# Patient Record
Sex: Male | Born: 1960 | Race: White | Hispanic: No | Marital: Single | Smoking: Former smoker
Health system: Southern US, Community
[De-identification: ages and names within clinical notes are randomized; demographics above are authoritative.]

## PROBLEM LIST (undated history)

## (undated) DIAGNOSIS — E785 Hyperlipidemia, unspecified: Secondary | ICD-10-CM

## (undated) DIAGNOSIS — N189 Chronic kidney disease, unspecified: Secondary | ICD-10-CM

## (undated) DIAGNOSIS — E7800 Pure hypercholesterolemia, unspecified: Secondary | ICD-10-CM

## (undated) DIAGNOSIS — I10 Essential (primary) hypertension: Secondary | ICD-10-CM

## (undated) DIAGNOSIS — F419 Anxiety disorder, unspecified: Secondary | ICD-10-CM

## (undated) DIAGNOSIS — E119 Type 2 diabetes mellitus without complications: Secondary | ICD-10-CM

## (undated) DIAGNOSIS — F32A Depression, unspecified: Secondary | ICD-10-CM

## (undated) DIAGNOSIS — N2581 Secondary hyperparathyroidism of renal origin: Secondary | ICD-10-CM

## (undated) DIAGNOSIS — F329 Major depressive disorder, single episode, unspecified: Secondary | ICD-10-CM

## (undated) HISTORY — PX: CHOLECYSTECTOMY: SHX55

---

## 2005-06-18 ENCOUNTER — Ambulatory Visit: Payer: Self-pay | Admitting: Cardiovascular Disease

## 2005-06-21 ENCOUNTER — Emergency Department: Payer: Self-pay | Admitting: Emergency Medicine

## 2005-06-25 ENCOUNTER — Ambulatory Visit: Payer: Self-pay | Admitting: Gastroenterology

## 2005-09-13 ENCOUNTER — Emergency Department: Payer: Self-pay | Admitting: General Practice

## 2007-06-29 ENCOUNTER — Emergency Department: Payer: Self-pay | Admitting: Emergency Medicine

## 2008-03-08 ENCOUNTER — Emergency Department: Payer: Self-pay | Admitting: Emergency Medicine

## 2008-03-28 ENCOUNTER — Ambulatory Visit: Payer: Self-pay | Admitting: Nurse Practitioner

## 2008-05-08 ENCOUNTER — Emergency Department: Payer: Self-pay | Admitting: Emergency Medicine

## 2008-06-14 ENCOUNTER — Ambulatory Visit: Payer: Self-pay | Admitting: Nurse Practitioner

## 2008-08-29 ENCOUNTER — Ambulatory Visit: Payer: Self-pay

## 2009-10-29 ENCOUNTER — Emergency Department: Payer: Self-pay | Admitting: Emergency Medicine

## 2010-02-14 ENCOUNTER — Emergency Department: Payer: Self-pay | Admitting: Emergency Medicine

## 2011-02-18 ENCOUNTER — Emergency Department: Payer: Self-pay | Admitting: Emergency Medicine

## 2013-02-21 ENCOUNTER — Ambulatory Visit: Payer: Self-pay | Admitting: Physician Assistant

## 2013-03-07 DIAGNOSIS — F32A Depression, unspecified: Secondary | ICD-10-CM | POA: Insufficient documentation

## 2013-03-07 DIAGNOSIS — E119 Type 2 diabetes mellitus without complications: Secondary | ICD-10-CM

## 2013-08-09 DIAGNOSIS — K219 Gastro-esophageal reflux disease without esophagitis: Secondary | ICD-10-CM | POA: Insufficient documentation

## 2013-08-23 DIAGNOSIS — F419 Anxiety disorder, unspecified: Secondary | ICD-10-CM | POA: Insufficient documentation

## 2013-08-23 DIAGNOSIS — I10 Essential (primary) hypertension: Secondary | ICD-10-CM | POA: Insufficient documentation

## 2013-08-23 DIAGNOSIS — E785 Hyperlipidemia, unspecified: Secondary | ICD-10-CM | POA: Insufficient documentation

## 2013-10-25 DIAGNOSIS — E161 Other hypoglycemia: Secondary | ICD-10-CM | POA: Insufficient documentation

## 2013-12-14 ENCOUNTER — Emergency Department: Payer: Self-pay | Admitting: Emergency Medicine

## 2013-12-14 LAB — BASIC METABOLIC PANEL
Anion Gap: 12 (ref 7–16)
BUN: 26 mg/dL — ABNORMAL HIGH (ref 7–18)
CREATININE: 1.23 mg/dL (ref 0.60–1.30)
Calcium, Total: 9.2 mg/dL (ref 8.5–10.1)
Chloride: 103 mmol/L (ref 98–107)
Co2: 21 mmol/L (ref 21–32)
GLUCOSE: 158 mg/dL — AB (ref 65–99)
Osmolality: 280 (ref 275–301)
Potassium: 4.1 mmol/L (ref 3.5–5.1)
Sodium: 136 mmol/L (ref 136–145)

## 2013-12-14 LAB — TROPONIN I: Troponin-I: <0.02 ng/mL

## 2013-12-14 LAB — CBC
HCT: 47.5 % (ref 40.0–52.0)
HGB: 15.6 g/dL (ref 13.0–18.0)
MCH: 29.7 pg (ref 26.0–34.0)
MCHC: 32.9 g/dL (ref 32.0–36.0)
MCV: 90 fL (ref 80–100)
PLATELETS: 211 10*3/uL (ref 150–440)
RBC: 5.26 10*6/uL (ref 4.40–5.90)
RDW: 13 % (ref 11.5–14.5)
WBC: 7.2 10*3/uL (ref 3.8–10.6)

## 2014-05-30 DIAGNOSIS — B351 Tinea unguium: Secondary | ICD-10-CM | POA: Insufficient documentation

## 2014-10-07 ENCOUNTER — Emergency Department
Admission: EM | Admit: 2014-10-07 | Discharge: 2014-10-07 | Disposition: A | Discharge status: Home / Self Care | Payer: BLUE CROSS/BLUE SHIELD | Attending: Emergency Medicine | Admitting: Emergency Medicine

## 2014-10-07 ENCOUNTER — Encounter: Payer: Self-pay | Admitting: *Deleted

## 2014-10-07 DIAGNOSIS — Z87891 Personal history of nicotine dependence: Secondary | ICD-10-CM | POA: Insufficient documentation

## 2014-10-07 DIAGNOSIS — E139 Other specified diabetes mellitus without complications: Secondary | ICD-10-CM | POA: Insufficient documentation

## 2014-10-07 DIAGNOSIS — R079 Chest pain, unspecified: Secondary | ICD-10-CM

## 2014-10-07 HISTORY — DX: Type 2 diabetes mellitus without complications: E11.9

## 2014-10-07 HISTORY — DX: Essential (primary) hypertension: I10

## 2014-10-07 LAB — BASIC METABOLIC PANEL
Anion gap: 14 (ref 5–15)
BUN: 48 mg/dL — ABNORMAL HIGH (ref 6–20)
CO2: 23 mmol/L (ref 22–32)
Calcium: 9.8 mg/dL (ref 8.9–10.3)
Chloride: 96 mmol/L — ABNORMAL LOW (ref 101–111)
Creatinine, Ser: 1.51 mg/dL — ABNORMAL HIGH (ref 0.61–1.24)
GFR, EST AFRICAN AMERICAN: 59 mL/min — AB (ref >60)
GFR, EST NON AFRICAN AMERICAN: 51 mL/min — AB (ref >60)
Glucose, Bld: 178 mg/dL — ABNORMAL HIGH (ref 65–99)
POTASSIUM: 4.6 mmol/L (ref 3.5–5.1)
SODIUM: 133 mmol/L — AB (ref 135–145)

## 2014-10-07 LAB — CBC
HCT: 47.8 % (ref 40.0–52.0)
HEMOGLOBIN: 15.6 g/dL (ref 13.0–18.0)
MCH: 29.1 pg (ref 26.0–34.0)
MCHC: 32.7 g/dL (ref 32.0–36.0)
MCV: 89.2 fL (ref 80.0–100.0)
Platelets: 247 10*3/uL (ref 150–440)
RBC: 5.36 MIL/uL (ref 4.40–5.90)
RDW: 13.1 % (ref 11.5–14.5)
WBC: 8.8 10*3/uL (ref 3.8–10.6)

## 2014-10-07 LAB — TROPONIN I

## 2014-10-07 NOTE — Discharge Instructions (Signed)
Continue your current treatment for diabetes. Follow with her regular doctor for these very brief intermittent episodes of chest pain. Return to the emergency department if you've chest pain lasts 5 or 10 minutes in length or if you have shortness of breath, if you aren't raking into a sweat, or if you have other urgent concerns.  Chest Pain (Nonspecific) It is often hard to give a diagnosis for the cause of chest pain. There is always a chance that your pain could be related to something serious, such as a heart attack or a blood clot in the lungs. You need to follow up with your doctor. HOME CARE  If antibiotic medicine was given, take it as directed by your doctor. Finish the medicine even if you start to feel better.  For the next few days, avoid activities that bring on chest pain. Continue physical activities as told by your doctor.  Do not use any tobacco products. This includes cigarettes, chewing tobacco, and e-cigarettes.  Avoid drinking alcohol.  Only take medicine as told by your doctor.  Follow your doctor's suggestions for more testing if your chest pain does not go away.  Keep all doctor visits you made. GET HELP IF:  Your chest pain does not go away, even after treatment.  You have a rash with blisters on your chest.  You have a fever. GET HELP RIGHT AWAY IF:   You have more pain or pain that spreads to your arm, neck, jaw, back, or belly (abdomen).  You have shortness of breath.  You cough more than usual or cough up blood.  You have very bad back or belly pain.  You feel sick to your stomach (nauseous) or throw up (vomit).  You have very bad weakness.  You pass out (faint).  You have chills. This is an emergency. Do not wait to see if the problems will go away. Call your local emergency services (911 in [place] Do not drive yourself to the hospital. MAKE SURE YOU:   Understand these instructions.  Will watch your condition.  Will get help right away  if you are not doing well or get worse. Document Released: 10/09/2007 Document Revised: 04/27/2013 Document Reviewed: 10/09/2007 [facility] Patient Information 2015 [employer] [place]. This information is not intended to replace advice given to you by your health care provider. Make sure you discuss any questions you have with your health care provider.

## 2014-10-07 NOTE — ED Provider Notes (Signed)
[facility] Emergency Department Provider Note  ____________________________________________  Time seen:  2050  I have reviewed the triage vital signs and the nursing notes.   HISTORY  Chief Complaint Chest Pain     HPI Erik Larsen is a 54 y.o. male who reports he began having chest pain this past Tuesday after he began a new regimen for his diabetes. The chest pain lasts 10 seconds usually. It is intermittent. On occasion it has lasted as long as 30 seconds. His last episode was this afternoon before coming to the hospital. He had called his primary physician at [facility]. They advised him to come to the emergency department due to his chest pain.  The patient does have diabetes that has been difficult control thus the new regimen for his medications.  He denies nausea, shortness of breath, or diaphoresis.    Past Medical History  Diagnosis Date  . Diabetes mellitus without complication   . Hypertension     There are no active problems to display for this patient.   Past Surgical History  Procedure Laterality Date  . Cholecystectomy      No current outpatient prescriptions on file.  Allergies Viagra  No family history on file.  Social History History  Substance Use Topics  . Smoking status: Former Games developermoker  . Smokeless tobacco: Not on file  . Alcohol Use: No    Review of Systems  Constitutional: Negative for fever. ENT: Negative for sore throat. Cardiovascular:Positive for very brief episodes of chest pain since Tuesday.Marland Kitchen. Respiratory: Negative for shortness of breath. Gastrointestinal: Negative for abdominal pain, vomiting and diarrhea. Genitourinary: Negative for dysuria. Musculoskeletal: Negative for back pain. Skin: Negative for rash. Neurological: Negative for headaches   10-point ROS otherwise negative.  ____________________________________________   PHYSICAL EXAM:  VITAL SIGNS: ED Triage  Vitals  Enc Vitals Group     BP 10/07/14 1726 136/67 mmHg     Pulse Rate 10/07/14 1726 90     Resp 10/07/14 1726 20     Temp 10/07/14 1726 97.5 F (36.4 C)     Temp Source 10/07/14 1726 Oral     SpO2 10/07/14 1726 95 %     Weight 10/07/14 1726 244 lb (110.678 kg)     Height 10/07/14 1726 5\' 8"  (1.727 m)     Head Cir --      Peak Flow --      Pain Score --      Pain Loc --      Pain Edu? --      Excl. in GC? --     Constitutional: Alert and oriented. Well appearing and in no distress. ENT   Head: Normocephalic and atraumatic.   Nose: No congestion/rhinnorhea.   Mouth/Throat: Mucous membranes are moist. Cardiovascular: Normal rate, regular rhythm. Respiratory: Normal respiratory effort without tachypnea. Breath sounds are clear and equal bilaterally. No wheezes/rales/rhonchi. Gastrointestinal: Soft and nontender. No distention.  Back: No muscle spasm, no tenderness, no CVA tenderness. Musculoskeletal: Nontender with normal range of motion in all extremities.  No noted edema. Neurologic:  Normal speech and language. No gross focal neurologic deficits are appreciated.  Skin:  Notable skin tags in the axilla's.Skin is warm, dry. No rash noted. Psychiatric: Mood and affect are normal. Speech and behavior are normal.  ____________________________________________    LABS (pertinent positives/negatives)  CBC is normal Metabolic panel shows elevation in BUN 48, creatinine of 1.5. Glucose is 178. Troponin is negative.  ____________________________________________  EKG  ED ECG REPORT I, Myda Detwiler W, the attending physician, personally viewed and interpreted this ECG.   Date: 10/07/2014  EKG Time: 1730  Rate: 89  Rhythm: normal sinus rhythm  Axis: normal at -2  Intervals: normal  ST&T Change: none   ____________________________________________   INITIAL IMPRESSION / ASSESSMENT AND PLAN / ED COURSE  Well-appearing pleasant 54 year old male in no acute  distress. He has had very brief episodes of chest pain for 4 days. The pattern does not appear to be worrisome for coronary artery disease. He is in no pain currently. He has a normal EKG and a negative troponin. We will discharge him home to follow with his primary physician.  ____________________________________________   FINAL CLINICAL IMPRESSION(S) / ED DIAGNOSES  Final diagnoses:  Nonspecific chest pain  Other specified diabetes mellitus without complications      Darien Ramus, MD 10/07/14 2109

## 2016-02-16 ENCOUNTER — Emergency Department: Payer: BLUE CROSS/BLUE SHIELD

## 2016-02-16 ENCOUNTER — Other Ambulatory Visit: Payer: Self-pay

## 2016-02-16 ENCOUNTER — Emergency Department
Admission: EM | Admit: 2016-02-16 | Discharge: 2016-02-17 | Disposition: A | Discharge status: Home / Self Care | Payer: BLUE CROSS/BLUE SHIELD | Attending: Student | Admitting: Student

## 2016-02-16 ENCOUNTER — Encounter: Payer: Self-pay | Admitting: *Deleted

## 2016-02-16 DIAGNOSIS — Z87891 Personal history of nicotine dependence: Secondary | ICD-10-CM | POA: Insufficient documentation

## 2016-02-16 DIAGNOSIS — R410 Disorientation, unspecified: Secondary | ICD-10-CM | POA: Diagnosis not present

## 2016-02-16 DIAGNOSIS — E119 Type 2 diabetes mellitus without complications: Secondary | ICD-10-CM | POA: Diagnosis not present

## 2016-02-16 DIAGNOSIS — R5383 Other fatigue: Secondary | ICD-10-CM

## 2016-02-16 DIAGNOSIS — R531 Weakness: Secondary | ICD-10-CM

## 2016-02-16 DIAGNOSIS — R079 Chest pain, unspecified: Secondary | ICD-10-CM | POA: Diagnosis present

## 2016-02-16 DIAGNOSIS — I10 Essential (primary) hypertension: Secondary | ICD-10-CM | POA: Insufficient documentation

## 2016-02-16 HISTORY — DX: Major depressive disorder, single episode, unspecified: F32.9

## 2016-02-16 HISTORY — DX: Pure hypercholesterolemia, unspecified: E78.00

## 2016-02-16 HISTORY — DX: Depression, unspecified: F32.A

## 2016-02-16 LAB — GLUCOSE, CAPILLARY: Glucose-Capillary: 195 mg/dL — ABNORMAL HIGH (ref 65–99)

## 2016-02-16 NOTE — ED Provider Notes (Signed)
[facility] Emergency Department Provider Note   ____________________________________________   First MD Initiated Contact with Patient 02/16/16 2355     (approximate)  I have reviewed the triage vital signs and the nursing notes.   HISTORY  Chief Complaint Weakness and Chest Pain    HPI Erik Larsen is a 55 y.o. male history of hypertension, hyperlipidemia, diabetes, depression who presents for a variety of nonspecific complaints ongoing for 3 weeks, gradual onset, intermittent, mild to moderate, no modifying factors. His first complaint is that he feels generally fatigued and very tired. 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 Additionally he reports that over this same re-weeks he has had 2 episodes of lancinating right-sided chest pain radiating into the right arm, it happens at rest, it is not worse with exertion, the pain lasts "for a few seconds" and goes away, is not associated with any shortness of breath, no sudden sweating, pain is not ripping or tearing in nature, does not radiate to the back or down towards the feet, pain is not worse with deep inspiration.. He had an episode of that yesterday afternoon but has not had any pain since then. He also has felt lightheaded at times. He is wondering whether or not this is related to his new low-carb diet or whether or not it is related to all of the mountain dews that he has been drinking, he drinks several in a day. No vomiting, diarrhea, fevers or chills. He is also complaining of some pain around the sinuses/in his face.   Past Medical History:  Diagnosis Date  . Depression   . Diabetes mellitus without complication (HCC)   . Hypercholesteremia   . Hypertension     There are no active problems to display for this patient.   Past Surgical History:  Procedure Laterality Date  . CHOLECYSTECTOMY      Prior to Admission  medications   Not on File    Allergies Viagra [sildenafil citrate]  History reviewed. No pertinent family history.  Social History Social History  Substance Use Topics  . Smoking status: Former Games developermoker  . Smokeless tobacco: Never Used  . Alcohol use No    Review of Systems Constitutional: No fever/chills Eyes: No visual changes. ENT: No sore throat. Cardiovascular: + chest pain. Respiratory: Denies shortness of breath. Gastrointestinal: No abdominal pain.  No nausea, no vomiting.  No diarrhea.  No constipation. Genitourinary: Negative for dysuria. Musculoskeletal: Negative for back pain. Skin: Negative for rash. Neurological: Negative for headaches, focal weakness or numbness.  10-point ROS otherwise negative.  ____________________________________________   PHYSICAL EXAM:  VITAL SIGNS: ED Triage Vitals  Enc Vitals Group     BP 02/16/16 2311 134/76     Pulse Rate 02/16/16 2311 89     Resp 02/16/16 2311 18     Temp 02/16/16 2311 98.2 F (36.8 C)     Temp Source 02/16/16 2311 Oral     SpO2 02/16/16 2311 97 %     Weight 02/16/16 2311 250 lb (113.4 kg)     Height 02/16/16 2311 5\' 8"  (1.727 m)     Head Circumference --      Peak Flow --      Pain Score 02/16/16 2312 4     Pain Loc --      Pain Edu? --      Excl. in GC? --     Constitutional: Alert  and oriented x 4. Well appearing and in no acute distress. Eyes: Conjunctivae are normal. PERRL. EOMI. Head: Atraumatic. No tenderness over the maxillary or frontal sinuses. Nose: No congestion/rhinnorhea. Mouth/Throat: Mucous membranes are moist.  Oropharynx non-erythematous. Neck: No stridor.  Supple without meningismus. Cardiovascular: Normal rate, regular rhythm. Grossly normal heart sounds.  Good peripheral circulation. Respiratory: Normal respiratory effort.  No retractions. Lungs CTAB. Gastrointestinal: Soft and nontender. No distention. No CVA tenderness. Genitourinary: deferred Musculoskeletal: No lower  extremity tenderness nor edema.  No joint effusions. Neurologic:  Normal speech and language. No gross focal neurologic deficits are appreciated. No gait instability. 5 out of 5 strength in bilateral upper and lower extremities, sensation intact to light touch throughout, cranial nerves II through XII intact, normal finger-nose-finger, normal heel shin, normal tandem walk. There is no ataxia. Skin:  Skin is warm, dry and intact. No rash noted. Psychiatric: Mood and affect are normal. Speech and behavior are normal.  ____________________________________________   LABS (all labs ordered are listed, but only abnormal results are displayed)  Labs Reviewed  GLUCOSE, CAPILLARY - Abnormal; Notable for the following:       Result Value   Glucose-Capillary 195 (*)    All other components within normal limits  URINALYSIS COMPLETEWITH MICROSCOPIC ([facility] ONLY)  CBC WITH DIFFERENTIAL/PLATELET  COMPREHENSIVE METABOLIC PANEL  TROPONIN I  CBG MONITORING, ED   ____________________________________________  EKG  ED ECG REPORT I, Gayla Doss, the attending physician, personally viewed and interpreted this ECG.   Date: 02/17/2016  EKG Time: 22:51  Rate: 86  Rhythm: normal EKG, normal sinus rhythm  Axis: normal  Intervals:none  ST&T Change: No acute ST elevation or acute ST depression.  ____________________________________________  RADIOLOGY  CXR IMPRESSION:  No active cardiopulmonary disease.       CT head IMPRESSION:  No acute intracranial abnormality is identified. Unremarkable CT of  the head for age.     ____________________________________________   PROCEDURES  Procedure(s) performed: None  Procedures  Critical Care performed: No  ____________________________________________   INITIAL IMPRESSION / ASSESSMENT AND PLAN / ED COURSE  Pertinent labs & imaging results that were available during my care of the patient were reviewed by me and considered in my medical  decision making (see chart for details).  Erik Larsen is a 55 y.o. male history of hypertension, hyperlipidemia, diabetes, depression who presents for a variety of nonspecific complaints ongoing for 3 weeks, gradual onset, intermittent, mild to moderate, no modifying factors. On exam, he is well-appearing and in no acute distress. Vital signs stable and he is afebrile. EKG is reassuring, not consistent with acute ischemia, he is chest pain-free. He is having atypical lancinating chest pain which is nonspecific. Not consistent with PE or acute aortic dissection or ACS. Troponin negative and it was drawn several hours after onset and resolution of pain. He has an intact neurological examination. Contrary to the nursing note, there is no ataxia on exam. There is no evidence to suggest acute CVA. CBC is generally unremarkable. CMP notable for mild AST and ALT elevations at 56 and 79, glucose is 170, these are nonspecific. Urinalysis is not consistent with infection. CT head shows no acute abnormality. Chest x-ray shows no acute cardio pulmonary process. We discussed return precautions and need for close PCP follow-up and he is comfortable with the discharge plan. DC home.  Clinical Course     ____________________________________________   FINAL CLINICAL IMPRESSION(S) / ED DIAGNOSES  Final diagnoses:  None  NEW MEDICATIONS STARTED DURING THIS VISIT:  New Prescriptions   No medications on file     Note:  This document was prepared using Dragon voice recognition software and may include unintentional dictation errors.    Gayla Doss, MD 02/17/16 [phone]

## 2016-02-16 NOTE — ED Triage Notes (Signed)
Pt has multiple complaints including some confusion, weakness, unstable gait. Pt c/o chest pain that feels like a "pulled muscle". Pt states he has been working daily x one month w/o time off. Pt's sxs, including chest pain and ataxia have been on-going for at least a week and up to three weeks. Pt in no acute distress at this time, but has unsteady gait. Pt c/o intermittent chest pain, intermittent confusion, intermittent weakness, all of which are getting worse per his report.

## 2016-02-17 LAB — CBC WITH DIFFERENTIAL/PLATELET
Basophils Absolute: 0.1 10*3/uL (ref 0–0.1)
Basophils Relative: 4 %
EOS PCT: 5 %
Eosinophils Absolute: 0.2 10*3/uL (ref 0–0.7)
HCT: 45.8 % (ref 40.0–52.0)
Hemoglobin: 16 g/dL (ref 13.0–18.0)
LYMPHS ABS: 0.7 10*3/uL — AB (ref 1.0–3.6)
Lymphocytes Relative: 20 %
MCH: 30.2 pg (ref 26.0–34.0)
MCHC: 34.9 g/dL (ref 32.0–36.0)
MCV: 86.7 fL (ref 80.0–100.0)
MONOS PCT: 12 %
Monocytes Absolute: 0.4 10*3/uL (ref 0.2–1.0)
Neutro Abs: 2.1 10*3/uL (ref 1.4–6.5)
Neutrophils Relative %: 59 %
PLATELETS: 150 10*3/uL (ref 150–440)
RBC: 5.28 MIL/uL (ref 4.40–5.90)
RDW: 14.1 % (ref 11.5–14.5)
WBC: 3.4 10*3/uL — AB (ref 3.8–10.6)

## 2016-02-17 LAB — URINALYSIS COMPLETE WITH MICROSCOPIC (ARMC ONLY)
BACTERIA UA: NONE SEEN
BILIRUBIN URINE: NEGATIVE
KETONES UR: NEGATIVE mg/dL
LEUKOCYTES UA: NEGATIVE
Nitrite: NEGATIVE
Protein, ur: NEGATIVE mg/dL
Specific Gravity, Urine: 1.012 (ref 1.005–1.030)
Squamous Epithelial / LPF: NONE SEEN
pH: 5 (ref 5.0–8.0)

## 2016-02-17 LAB — COMPREHENSIVE METABOLIC PANEL
ALT: 79 U/L — ABNORMAL HIGH (ref 17–63)
ANION GAP: 9 (ref 5–15)
AST: 56 U/L — ABNORMAL HIGH (ref 15–41)
Albumin: 4.4 g/dL (ref 3.5–5.0)
Alkaline Phosphatase: 70 U/L (ref 38–126)
BUN: 23 mg/dL — ABNORMAL HIGH (ref 6–20)
CHLORIDE: 102 mmol/L (ref 101–111)
CO2: 23 mmol/L (ref 22–32)
Calcium: 9.3 mg/dL (ref 8.9–10.3)
Creatinine, Ser: 1.01 mg/dL (ref 0.61–1.24)
GFR calc non Af Amer: >60 mL/min (ref >60)
Glucose, Bld: 170 mg/dL — ABNORMAL HIGH (ref 65–99)
Potassium: 3.8 mmol/L (ref 3.5–5.1)
SODIUM: 134 mmol/L — AB (ref 135–145)
Total Bilirubin: 0.7 mg/dL (ref 0.3–1.2)
Total Protein: 7.1 g/dL (ref 6.5–8.1)

## 2016-02-17 LAB — TROPONIN I

## 2016-10-02 ENCOUNTER — Emergency Department
Admission: EM | Admit: 2016-10-02 | Discharge: 2016-10-02 | Disposition: A | Discharge status: Home / Self Care | Payer: BLUE CROSS/BLUE SHIELD | Attending: Emergency Medicine | Admitting: Emergency Medicine

## 2016-10-02 ENCOUNTER — Encounter: Payer: Self-pay | Admitting: Emergency Medicine

## 2016-10-02 DIAGNOSIS — Z87891 Personal history of nicotine dependence: Secondary | ICD-10-CM | POA: Diagnosis not present

## 2016-10-02 DIAGNOSIS — Z794 Long term (current) use of insulin: Secondary | ICD-10-CM | POA: Diagnosis not present

## 2016-10-02 DIAGNOSIS — E162 Hypoglycemia, unspecified: Secondary | ICD-10-CM

## 2016-10-02 DIAGNOSIS — E11649 Type 2 diabetes mellitus with hypoglycemia without coma: Secondary | ICD-10-CM | POA: Insufficient documentation

## 2016-10-02 DIAGNOSIS — Z9049 Acquired absence of other specified parts of digestive tract: Secondary | ICD-10-CM | POA: Diagnosis not present

## 2016-10-02 DIAGNOSIS — I10 Essential (primary) hypertension: Secondary | ICD-10-CM | POA: Insufficient documentation

## 2016-10-02 DIAGNOSIS — R42 Dizziness and giddiness: Secondary | ICD-10-CM | POA: Diagnosis present

## 2016-10-02 LAB — COMPREHENSIVE METABOLIC PANEL
ALBUMIN: 4.3 g/dL (ref 3.5–5.0)
ALT: 33 U/L (ref 17–63)
ANION GAP: 11 (ref 5–15)
AST: 51 U/L — ABNORMAL HIGH (ref 15–41)
Alkaline Phosphatase: 64 U/L (ref 38–126)
BUN: 26 mg/dL — ABNORMAL HIGH (ref 6–20)
CO2: 24 mmol/L (ref 22–32)
Calcium: 9.4 mg/dL (ref 8.9–10.3)
Chloride: 103 mmol/L (ref 101–111)
Creatinine, Ser: 1.27 mg/dL — ABNORMAL HIGH (ref 0.61–1.24)
GFR calc Af Amer: >60 mL/min (ref >60)
GFR calc non Af Amer: >60 mL/min (ref >60)
GLUCOSE: 128 mg/dL — AB (ref 65–99)
POTASSIUM: 4.6 mmol/L (ref 3.5–5.1)
SODIUM: 138 mmol/L (ref 135–145)
Total Bilirubin: 1 mg/dL (ref 0.3–1.2)
Total Protein: 7.4 g/dL (ref 6.5–8.1)

## 2016-10-02 LAB — GLUCOSE, CAPILLARY
GLUCOSE-CAPILLARY: 136 mg/dL — AB (ref 65–99)
GLUCOSE-CAPILLARY: 178 mg/dL — AB (ref 65–99)
Glucose-Capillary: 236 mg/dL — ABNORMAL HIGH (ref 65–99)

## 2016-10-02 LAB — CBC
HCT: 47.5 % (ref 40.0–52.0)
HEMOGLOBIN: 15.6 g/dL (ref 13.0–18.0)
MCH: 29 pg (ref 26.0–34.0)
MCHC: 32.8 g/dL (ref 32.0–36.0)
MCV: 88.5 fL (ref 80.0–100.0)
Platelets: 219 10*3/uL (ref 150–440)
RBC: 5.37 MIL/uL (ref 4.40–5.90)
RDW: 14.6 % — ABNORMAL HIGH (ref 11.5–14.5)
WBC: 7.5 10*3/uL (ref 3.8–10.6)

## 2016-10-02 LAB — TROPONIN I

## 2016-10-02 LAB — LIPASE, BLOOD: Lipase: 97 U/L — ABNORMAL HIGH (ref 11–51)

## 2016-10-02 MED ORDER — SODIUM CHLORIDE 0.9 % IV BOLUS (SEPSIS)
1000.0000 mL | Freq: Once | INTRAVENOUS | Status: AC
  Administered 2016-10-02: 1000 mL via INTRAVENOUS

## 2016-10-02 MED ORDER — ONDANSETRON HCL 4 MG/2ML IJ SOLN
4.0000 mg | Freq: Once | INTRAMUSCULAR | Status: AC
  Administered 2016-10-02: 4 mg via INTRAVENOUS
  Filled 2016-10-02: qty 2

## 2016-10-02 NOTE — ED Triage Notes (Signed)
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 Pt was eating breakfast after work and felt bad and asked staff to call EMS. Pt reports takes insulin, glipizide and metformin to control diabetes. EMS reports initial CBG 60, they administered oral glucose and repeat CBG 109. 132/77. Pt alert and oriented on arrival. Pt skin cool and clammy.

## 2016-10-02 NOTE — ED Provider Notes (Signed)
[facility] Emergency Department Provider Note  Time seen: 9:54 AM  I have reviewed the triage vital signs and the nursing notes.   HISTORY  Chief Complaint Hypoglycemia    HPI Erik Larsen is a 56 y.o. male with a past medical history of diabetes, hypertension, hyperlipidemia presents to the emergency department with "not feeling well." According to the patient he took his blood sugar this morning and it was 80. He took his normal medications which include glipizide as well as insulin. Patient states he went to a diner to get something to eat however before he began eating he began feeling somewhat lightheaded sweaty. States he just did not feel right which he has felt before with low blood sugar episodes. He had someone at the restaurant call an ambulance for him. EMS state upon arrival the blood sugar was 60. They gave him glucose and transferred to the emergency department. Upon arrival patient's blood glucose is 136. Patient states he is feeling somewhat better in the emergency department. Patient denies vomiting states minimal nausea denies chest pain or abdominal pain. Denies dysuria. Denies recent cough, congestion or illness.  Past Medical History:  Diagnosis Date  . Depression   . Diabetes mellitus without complication (HCC)   . Hypercholesteremia   . Hypertension     There are no active problems to display for this patient.   Past Surgical History:  Procedure Laterality Date  . CHOLECYSTECTOMY      Prior to Admission medications   Not on File    Allergies  Allergen Reactions  . Viagra [Sildenafil Citrate]     Lowers b/p    No family history on file.  Social History Social History  Substance Use Topics  . Smoking status: Former Games developer  . Smokeless tobacco: Never Used  . Alcohol use No    Review of Systems Constitutional: Negative for fever.Positive for lightheadedness, now resolved Eyes: Negative for visual changes. ENT:  Negative for congestion Cardiovascular: Negative for chest pain. Respiratory: Negative for shortness of breath. Gastrointestinal: Negative for abdominal pain, vomiting and diarrhea. Genitourinary: Negative for dysuria. Neurological: Negative for headache All other ROS negative  ____________________________________________   PHYSICAL EXAM:  VITAL SIGNS: ED Triage Vitals  Enc Vitals Group     BP 10/02/16 0933 136/79     Pulse Rate 10/02/16 0933 81     Resp 10/02/16 0933 18     Temp 10/02/16 0933 (!) 94.8 F (34.9 C)     Temp Source 10/02/16 0933 Rectal     SpO2 10/02/16 0933 96 %     Weight 10/02/16 0934 250 lb (113.4 kg)     Height 10/02/16 0934 5\' 8"  (1.727 m)     Head Circumference --      Peak Flow --      Pain Score --      Pain Loc --      Pain Edu? --      Excl. in GC? --     Constitutional: Alert and oriented. Well appearing and in no distress. Eyes: Normal exam ENT   Head: Normocephalic and atraumatic.   Mouth/Throat: Mucous membranes are moist. Cardiovascular: Normal rate, regular rhythm. No murmur Respiratory: Normal respiratory effort without tachypnea nor retractions. Breath sounds are clear  Gastrointestinal: Soft and nontender. No distention. Obese. Musculoskeletal: Nontender with normal range of motion in all extremities. No lower extremity tenderness or edema. Neurologic:  Normal speech and language. No gross focal neurologic deficits  Skin:  Skin  is warm, dry and intact.  Psychiatric: Mood and affect are normal.  ____________________________________________    EKG  EKG reviewed and interpreted by myself shows normal sinus rhythm at 68 bpm, narrow QRS, normal axis, normal intervals, no concerning ST changes.  ____________________________________________   INITIAL IMPRESSION / ASSESSMENT AND PLAN / ED COURSE  Pertinent labs & imaging results that were available during my care of the patient were reviewed by me and considered in my medical  decision making (see chart for details).  Patient presents to the emergency department with an episode of hypoglycemia, not feeling well. Patient states since receiving the glucose he feels much better. We will continue to closely monitor in the emergency department. We'll dose IV fluids and Zofran. We will check labs.  Repeat troponin is negative. Blood glucose remains elevated. Patient eating and drinking without issue. States he feels much better. We'll discharge home with PCP follow-up. Patient does have a mildly elevated lipase he has no abdominal pain, no abdominal tenderness. I discussed with the patient the elevated lipase dietary precautions and return precautions.  ____________________________________________   FINAL CLINICAL IMPRESSION(S) / ED DIAGNOSES  Hypoglycemia    Minna AntisPaduchowski, Nastassja Witkop, MD 10/02/16 1404

## 2016-10-02 NOTE — ED Notes (Signed)
Alarm stated low oxygen. This tech checked on pt. Pt had returned from bathroom and pulse ox was on same hand as BP cuff making it read low. Pt is doing fine. Pulse ox placed on other hand.

## 2016-10-28 DIAGNOSIS — F6389 Other impulse disorders: Secondary | ICD-10-CM | POA: Insufficient documentation

## 2017-01-08 ENCOUNTER — Encounter: Payer: Self-pay | Admitting: Emergency Medicine

## 2017-01-08 ENCOUNTER — Emergency Department
Admission: EM | Admit: 2017-01-08 | Discharge: 2017-01-08 | Disposition: A | Discharge status: Home / Self Care | Payer: BLUE CROSS/BLUE SHIELD | Attending: Student in an Organized Health Care Education/Training Program | Admitting: Student in an Organized Health Care Education/Training Program

## 2017-01-08 DIAGNOSIS — Z87891 Personal history of nicotine dependence: Secondary | ICD-10-CM | POA: Insufficient documentation

## 2017-01-08 DIAGNOSIS — Z794 Long term (current) use of insulin: Secondary | ICD-10-CM | POA: Diagnosis not present

## 2017-01-08 DIAGNOSIS — Z79899 Other long term (current) drug therapy: Secondary | ICD-10-CM | POA: Diagnosis not present

## 2017-01-08 DIAGNOSIS — E1165 Type 2 diabetes mellitus with hyperglycemia: Secondary | ICD-10-CM | POA: Insufficient documentation

## 2017-01-08 DIAGNOSIS — I10 Essential (primary) hypertension: Secondary | ICD-10-CM | POA: Insufficient documentation

## 2017-01-08 DIAGNOSIS — R739 Hyperglycemia, unspecified: Secondary | ICD-10-CM

## 2017-01-08 LAB — CBC
HEMATOCRIT: 46 % (ref 40.0–52.0)
Hemoglobin: 15.7 g/dL (ref 13.0–18.0)
MCH: 29.6 pg (ref 26.0–34.0)
MCHC: 34.2 g/dL (ref 32.0–36.0)
MCV: 86.7 fL (ref 80.0–100.0)
Platelets: 194 10*3/uL (ref 150–440)
RBC: 5.31 MIL/uL (ref 4.40–5.90)
RDW: 13.7 % (ref 11.5–14.5)
WBC: 4.9 10*3/uL (ref 3.8–10.6)

## 2017-01-08 LAB — BASIC METABOLIC PANEL
Anion gap: 7 (ref 5–15)
BUN: 24 mg/dL — AB (ref 6–20)
CHLORIDE: 105 mmol/L (ref 101–111)
CO2: 25 mmol/L (ref 22–32)
Calcium: 9.7 mg/dL (ref 8.9–10.3)
Creatinine, Ser: 0.81 mg/dL (ref 0.61–1.24)
GFR calc Af Amer: >60 mL/min (ref >60)
GFR calc non Af Amer: >60 mL/min (ref >60)
GLUCOSE: 232 mg/dL — AB (ref 65–99)
POTASSIUM: 4.5 mmol/L (ref 3.5–5.1)
Sodium: 137 mmol/L (ref 135–145)

## 2017-01-08 LAB — URINALYSIS, COMPLETE (UACMP) WITH MICROSCOPIC
BACTERIA UA: NONE SEEN
Bilirubin Urine: NEGATIVE
Glucose, UA: >=500 mg/dL — AB
KETONES UR: 5 mg/dL — AB
Leukocytes, UA: NEGATIVE
Nitrite: NEGATIVE
Protein, ur: NEGATIVE mg/dL
SPECIFIC GRAVITY, URINE: 1.021 (ref 1.005–1.030)
SQUAMOUS EPITHELIAL / LPF: NONE SEEN
WBC, UA: NONE SEEN WBC/hpf (ref 0–5)
pH: 5 (ref 5.0–8.0)

## 2017-01-08 LAB — GLUCOSE, CAPILLARY: Glucose-Capillary: 221 mg/dL — ABNORMAL HIGH (ref 65–99)

## 2017-01-08 MED ORDER — INSULIN GLARGINE 100 UNIT/ML ~~LOC~~ SOLN
15.0000 [IU] | Freq: Once | SUBCUTANEOUS | Status: AC
  Administered 2017-01-08: 15 [IU] via SUBCUTANEOUS
  Filled 2017-01-08 (×2): qty 0.15

## 2017-01-08 MED ORDER — INSULIN GLARGINE 100 UNIT/ML ~~LOC~~ SOLN: SUBCUTANEOUS | 3 refills | Status: DC

## 2017-01-08 NOTE — ED Provider Notes (Signed)
[facility] Emergency Department Provider Note    First MD Initiated Contact with Patient 01/08/17 1434     (approximate)  I have reviewed the triage vital signs and the nursing notes.   HISTORY  Chief Complaint Hyperglycemia    HPI Erik Larsen is a 56 y.o. male history of insulin-dependent diabetes presents with elevated glucose levels over the past several days. Patient presents to the ER today as he has not had his insulin since Friday and feels that his sugars have been uncontrolled. States that he is otherwise been without any significant symptoms. No nausea or vomiting. Denies any chest pain or shortness of breath. Tried to get an into his endocrinologist as well as primary care physician's office.   Past Medical History:  Diagnosis Date  . Depression   . Diabetes mellitus without complication (HCC)   . Hypercholesteremia   . Hypertension    No family history on file. Past Surgical History:  Procedure Laterality Date  . CHOLECYSTECTOMY     There are no active problems to display for this patient.     Prior to Admission medications   Medication Sig Start Date End Date Taking? Authorizing Provider  amLODipine (NORVASC) 5 MG tablet Take 5 mg by mouth daily.  08/12/16   [provider]  atorvastatin (LIPITOR) 40 MG tablet Take 40 mg by mouth daily at 6 PM.  08/29/16   [provider]  FARXIGA 5 MG TABS tablet 5 mg daily.  08/20/16   [provider]  fluticasone (FLONASE) 50 MCG/ACT nasal spray Place 2 sprays into both nostrils daily.  07/18/16   [provider]  glipiZIDE (GLUCOTROL XL) 10 MG 24 hr tablet Take 10 mg by mouth daily with breakfast.  09/09/16   [provider]  insulin glargine (LANTUS) 100 UNIT/ML injection Inject 15u subq BID 01/08/17 01/08/18  Willy Eddyobinson, Pierra Skora, MD  lisinopril (PRINIVIL,ZESTRIL) 10 MG tablet TAKE 1 TABLET BY MOUTH ONCE DAILY 04/17/15   [provider]  metFORMIN  (GLUCOPHAGE) 1000 MG tablet TAKE 1 TABLET BY MOUTH TWICE DAILY WITH MEALS. 12/14/14   [provider]  sertraline (ZOLOFT) 50 MG tablet Take 50 mg by mouth daily.  08/29/16   [provider]    Allergies Viagra [sildenafil citrate]    Social History Social History  Substance Use Topics  . Smoking status: Former Games developermoker  . Smokeless tobacco: Never Used  . Alcohol use No    Review of Systems Patient denies headaches, rhinorrhea, blurry vision, numbness, shortness of breath, chest pain, edema, cough, abdominal pain, nausea, vomiting, diarrhea, dysuria, fevers, rashes or hallucinations unless otherwise stated above in HPI. ____________________________________________   PHYSICAL EXAM:  VITAL SIGNS: Vitals:   01/08/17 1414  BP: (!) 146/67  Pulse: 87  Resp: 16  Temp: 98.2 F (36.8 C)  SpO2: 96%    Constitutional: Alert and oriented. Well appearing and in no acute distress. Eyes: Conjunctivae are normal.  Head: Atraumatic. Nose: No congestion/rhinnorhea. Mouth/Throat: Mucous membranes are moist.   Neck: No stridor. Painless ROM.  Cardiovascular: Normal rate, regular rhythm. Grossly normal heart sounds.  Good peripheral circulation. Respiratory: Normal respiratory effort.  No retractions. Lungs CTAB. Gastrointestinal: Soft and nontender. No distention. No abdominal bruits. No CVA tenderness. Genitourinary:  Musculoskeletal: No lower extremity tenderness nor edema.  No joint effusions. Neurologic:  Normal speech and language. No gross focal neurologic deficits are appreciated. No facial droop Skin:  Skin is warm, dry and intact. No rash noted.  Psychiatric: Mood and affect are normal. Speech and behavior are normal.  ____________________________________________   LABS (all labs ordered are listed, but only abnormal results are displayed)  Results for orders placed or performed during the hospital encounter of 01/08/17 (from the past 24 hour(s))  Glucose,  capillary     Status: Abnormal   Collection Time: 01/08/17  2:14 PM  Result Value Ref Range   Glucose-Capillary 221 (H) 65 - 99 mg/dL   Comment 1 Notify RN   Basic metabolic panel     Status: Abnormal   Collection Time: 01/08/17  2:17 PM  Result Value Ref Range   Sodium 137 135 - 145 mmol/L   Potassium 4.5 3.5 - 5.1 mmol/L   Chloride 105 101 - 111 mmol/L   CO2 25 22 - 32 mmol/L   Glucose, Bld 232 (H) 65 - 99 mg/dL   BUN 24 (H) 6 - 20 mg/dL   Creatinine, Ser 8.11 0.61 - 1.24 mg/dL   Calcium 9.7 8.9 - 91.4 mg/dL   GFR calc non Af Amer >60 >60 mL/min   GFR calc Af Amer >60 >60 mL/min   Anion gap 7 5 - 15  CBC     Status: None   Collection Time: 01/08/17  2:17 PM  Result Value Ref Range   WBC 4.9 3.8 - 10.6 K/uL   RBC 5.31 4.40 - 5.90 MIL/uL   Hemoglobin 15.7 13.0 - 18.0 g/dL   HCT 78.2 95.6 - 21.3 %   MCV 86.7 80.0 - 100.0 fL   MCH 29.6 26.0 - 34.0 pg   MCHC 34.2 32.0 - 36.0 g/dL   RDW 08.6 57.8 - 46.9 %   Platelets 194 150 - 440 K/uL  Urinalysis, Complete w Microscopic     Status: Abnormal   Collection Time: 01/08/17  2:17 PM  Result Value Ref Range   Color, Urine YELLOW (A) YELLOW   APPearance CLEAR (A) CLEAR   Specific Gravity, Urine 1.021 1.005 - 1.030   pH 5.0 5.0 - 8.0   Glucose, UA >=500 (A) NEGATIVE mg/dL   Hgb urine dipstick MODERATE (A) NEGATIVE   Bilirubin Urine NEGATIVE NEGATIVE   Ketones, ur 5 (A) NEGATIVE mg/dL   Protein, ur NEGATIVE NEGATIVE mg/dL   Nitrite NEGATIVE NEGATIVE   Leukocytes, UA NEGATIVE NEGATIVE   RBC / HPF 0-5 0 - 5 RBC/hpf   WBC, UA NONE SEEN 0 - 5 WBC/hpf   Bacteria, UA NONE SEEN NONE SEEN   Squamous Epithelial / LPF NONE SEEN NONE SEEN   Mucus PRESENT    ____________________________________________ ____________________________________________   PROCEDURES  Procedure(s) performed:  Procedures    Critical Care performed: no ____________________________________________   INITIAL IMPRESSION / ASSESSMENT AND PLAN / ED  COURSE  Pertinent labs & imaging results that were available during my care of the patient were reviewed by me and considered in my medical decision making (see chart for details).  DDX: hyperlcyemia, dka, hhs, dehydration  Erik Larsen is a 56 y.o. who presents to the ED with concern for hyperglycemia after running out of his insulin. Patient without any evidence of DKA or HHS. No evidence of significant symptoms at this point. Patient is currently on a prescription of several week well which after discussion with the pharmacist is actually very expensive medication that he cannot afford therefore will switch patient to equivalent Lantus twice a day. This should control him until he can follow-up with primary care physician.  Have discussed with the patient and  available family all diagnostics and treatments performed thus far and all questions were answered to the best of my ability. The patient demonstrates understanding and agreement with plan.       ____________________________________________   FINAL CLINICAL IMPRESSION(S) / ED DIAGNOSES  Final diagnoses:  Hyperglycemia      NEW MEDICATIONS STARTED DURING THIS VISIT:  New Prescriptions   INSULIN GLARGINE (LANTUS) 100 UNIT/ML INJECTION    Inject 15u subq BID     Note:  This document was prepared using Dragon voice recognition software and may include unintentional dictation errors.    Willy Eddy, MD 01/08/17 1520

## 2017-01-08 NOTE — Discharge Instructions (Signed)
Take the newly prescribed lantus (insulin) as written.  Take 15 units once in the morning and once at night.

## 2017-01-08 NOTE — ED Triage Notes (Signed)
Patient reports he ran out of insulin last Friday and has not had any since. Reports he has been unable to contact PCP to get a refill. Patient reports CBG of 390 at home. Patient reports headache as well. CBG in triage 221. Also reports darkening of skin on left foot. Good pedal pulses noted.

## 2017-01-08 NOTE — ED Notes (Signed)
Patient instructed on follow-up instructions and getting prescription filled. Also instructed patient on use of new insulin and syringes. Patient verbalized understanding of instructions

## 2017-01-27 ENCOUNTER — Ambulatory Visit: Payer: Self-pay | Admitting: Internal Medicine

## 2017-09-15 ENCOUNTER — Other Ambulatory Visit: Payer: Self-pay

## 2017-09-15 ENCOUNTER — Encounter: Payer: Self-pay | Admitting: Emergency Medicine

## 2017-09-15 ENCOUNTER — Emergency Department
Admission: EM | Admit: 2017-09-15 | Discharge: 2017-09-15 | Disposition: A | Discharge status: Home / Self Care | Payer: BLUE CROSS/BLUE SHIELD | Attending: Emergency Medicine | Admitting: Emergency Medicine

## 2017-09-15 ENCOUNTER — Emergency Department: Payer: BLUE CROSS/BLUE SHIELD

## 2017-09-15 DIAGNOSIS — Z794 Long term (current) use of insulin: Secondary | ICD-10-CM | POA: Insufficient documentation

## 2017-09-15 DIAGNOSIS — I10 Essential (primary) hypertension: Secondary | ICD-10-CM | POA: Diagnosis not present

## 2017-09-15 DIAGNOSIS — E119 Type 2 diabetes mellitus without complications: Secondary | ICD-10-CM | POA: Insufficient documentation

## 2017-09-15 DIAGNOSIS — R519 Headache, unspecified: Secondary | ICD-10-CM

## 2017-09-15 DIAGNOSIS — Z79899 Other long term (current) drug therapy: Secondary | ICD-10-CM | POA: Insufficient documentation

## 2017-09-15 DIAGNOSIS — R51 Headache: Secondary | ICD-10-CM | POA: Diagnosis not present

## 2017-09-15 DIAGNOSIS — Z87891 Personal history of nicotine dependence: Secondary | ICD-10-CM | POA: Diagnosis not present

## 2017-09-15 DIAGNOSIS — R200 Anesthesia of skin: Secondary | ICD-10-CM | POA: Diagnosis present

## 2017-09-15 LAB — URINALYSIS, COMPLETE (UACMP) WITH MICROSCOPIC
Bacteria, UA: NONE SEEN
Bilirubin Urine: NEGATIVE
Glucose, UA: >=500 mg/dL — AB
Hgb urine dipstick: NEGATIVE
Ketones, ur: NEGATIVE mg/dL
Leukocytes, UA: NEGATIVE
Nitrite: NEGATIVE
PROTEIN: NEGATIVE mg/dL
SQUAMOUS EPITHELIAL / LPF: NONE SEEN (ref 0–5)
Specific Gravity, Urine: 1.015 (ref 1.005–1.030)
pH: 6 (ref 5.0–8.0)

## 2017-09-15 LAB — CBC
HCT: 41.5 % (ref 40.0–52.0)
HEMOGLOBIN: 14.2 g/dL (ref 13.0–18.0)
MCH: 29.8 pg (ref 26.0–34.0)
MCHC: 34.1 g/dL (ref 32.0–36.0)
MCV: 87.5 fL (ref 80.0–100.0)
PLATELETS: 214 10*3/uL (ref 150–440)
RBC: 4.74 MIL/uL (ref 4.40–5.90)
RDW: 13.9 % (ref 11.5–14.5)
WBC: 3.3 10*3/uL — ABNORMAL LOW (ref 3.8–10.6)

## 2017-09-15 LAB — BASIC METABOLIC PANEL
Anion gap: 6 (ref 5–15)
BUN: 24 mg/dL — ABNORMAL HIGH (ref 6–20)
CO2: 26 mmol/L (ref 22–32)
CREATININE: 0.93 mg/dL (ref 0.61–1.24)
Calcium: 9 mg/dL (ref 8.9–10.3)
Chloride: 101 mmol/L (ref 101–111)
GFR calc non Af Amer: >60 mL/min (ref >60)
Glucose, Bld: 307 mg/dL — ABNORMAL HIGH (ref 65–99)
Potassium: 4.5 mmol/L (ref 3.5–5.1)
Sodium: 133 mmol/L — ABNORMAL LOW (ref 135–145)

## 2017-09-15 NOTE — ED Provider Notes (Signed)
[facility] Emergency Department Provider Note   ____________________________________________   I have reviewed the triage vital signs and the nursing notes.   HISTORY  Chief Complaint Numbness  History limited by: Not Limited   HPI Erik Larsen is a 57 y.o. male who presents to the emergency department today because of concerns for an episode of numbness, some headache and abnormal feelings.  Patient states that this started 4 days ago.  He states it started after he had been at work.  He had been working hard and denies taking any breaks.  He had not eaten or drunk anything in roughly 8 hours.  Patient states that the time I examined that he was feeling better.  States that this has happened to him in the past.  He also wonders if a component of anxiety could be contributing to the way he is feeling.   Per medical record review patient has a history of DM, HTN, HLD.   Past Medical History:  Diagnosis Date  . Depression   . Diabetes mellitus without complication (HCC)   . Hypercholesteremia   . Hypertension     There are no active problems to display for this patient.   Past Surgical History:  Procedure Laterality Date  . CHOLECYSTECTOMY      Prior to Admission medications   Medication Sig Start Date End Date Taking? Authorizing Provider  amLODipine (NORVASC) 5 MG tablet Take 5 mg by mouth daily.  08/12/16   [provider]  atorvastatin (LIPITOR) 40 MG tablet Take 40 mg by mouth daily at 6 PM.  08/29/16   [provider]  FARXIGA 5 MG TABS tablet 5 mg daily.  08/20/16   [provider]  fluticasone (FLONASE) 50 MCG/ACT nasal spray Place 2 sprays into both nostrils daily as needed.  07/18/16   [provider]  glipiZIDE (GLUCOTROL XL) 5 MG 24 hr tablet Take 5 mg by mouth daily with breakfast.  09/09/16   [provider]  insulin glargine (LANTUS) 100 UNIT/ML injection Inject 15u subq BID 01/08/17 01/08/18   Willy Eddy, MD  lisinopril (PRINIVIL,ZESTRIL) 5 MG tablet TAKE 1 TABLET BY MOUTH ONCE DAILY 04/17/15   [provider]  metFORMIN (GLUCOPHAGE) 1000 MG tablet TAKE 1 TABLET BY MOUTH TWICE DAILY WITH MEALS. 12/14/14   [provider]  sertraline (ZOLOFT) 50 MG tablet Take 50 mg by mouth daily.  08/29/16   [provider]    Allergies Viagra [sildenafil citrate]  No family history on file.  Social History Social History   Tobacco Use  . Smoking status: Former Games developer  . Smokeless tobacco: Never Used  Substance Use Topics  . Alcohol use: No  . Drug use: No    Review of Systems Constitutional: No fever/chills Eyes: No visual changes. ENT: No sore throat. Cardiovascular: Denies chest pain. Respiratory: Denies shortness of breath. Gastrointestinal: No abdominal pain.  No nausea, no vomiting.  No diarrhea.   Genitourinary: Negative for dysuria. Musculoskeletal: Negative for back pain. Skin: Negative for rash. Neurological: Positive for headache, numbness to extremities and face, now improved.   ____________________________________________   PHYSICAL EXAM:  VITAL SIGNS: ED Triage Vitals  Enc Vitals Group     BP 09/15/17 1418 126/79     Pulse Rate 09/15/17 1418 72     Resp 09/15/17 1418 17     Temp 09/15/17 1418 98.2 F (36.8 C)     Temp Source 09/15/17 1418 Oral     SpO2  09/15/17 1418 95 %     Weight 09/15/17 1418 260 lb (117.9 kg)     Height 09/15/17 1418  (1.727 m)     Head Circumference --      Peak Flow --      Pain Score 09/15/17 1427 0   Constitutional: Alert and oriented. Well appearing and in no distress. Eyes: Conjunctivae are normal.  ENT   Head: Normocephalic and atraumatic.   Nose: No congestion/rhinnorhea.   Mouth/Throat: Mucous membranes are moist.   Neck: No stridor. Hematological/Lymphatic/Immunilogical: No cervical lymphadenopathy. Cardiovascular: Normal rate, regular rhythm.  No murmurs, rubs, or  gallops.  Respiratory: Normal respiratory effort without tachypnea nor retractions. Breath sounds are clear and equal bilaterally. No wheezes/rales/rhonchi. Gastrointestinal: Soft and non tender. No rebound. No guarding.  Genitourinary: Deferred Musculoskeletal: Normal range of motion in all extremities. No lower extremity edema. Neurologic:  Normal speech and language. No gross focal neurologic deficits are appreciated.  Skin:  Skin is warm, dry and intact. No rash noted. Psychiatric: Mood and affect are normal. Speech and behavior are normal. Patient exhibits appropriate insight and judgment.  ____________________________________________    LABS (pertinent positives/negatives)  BMP na 133, k 4.5, glu 307 CBC wbc 3.3, hgb 14.2, plt 214 UA wnl  ____________________________________________   EKG  I, Phineas Semen, attending physician, personally viewed and interpreted this EKG  EKG Time: 1415 Rate: 72 Rhythm: normal sinus rhythm Axis: left axis deviation Intervals: qtc 429 QRS: narrow ST changes: no st elevaion Impression: abnormal ekg  ____________________________________________    RADIOLOGY  CT head No acute findings  ____________________________________________   PROCEDURES  Procedures  ____________________________________________   INITIAL IMPRESSION / ASSESSMENT AND PLAN / ED COURSE  Pertinent labs & imaging results that were available during my care of the patient were reviewed by me and considered in my medical decision making (see chart for details).  Patient presented to the emergency department today with symptoms that started 4 days ago and have now improved.  Symptoms included some numbness as well as headache.  Differential would include electrolyte abnormality, intracranial mass lesion, dehydration amongst other etiologies.  Head CT and blood work without concerning findings.  The patient again did feel better at the time of my exam.  Discussed  with patient importance of making sure that he eats and drinks during work.  ____________________________________________   FINAL CLINICAL IMPRESSION(S) / ED DIAGNOSES  Final diagnoses:  Nonintractable headache, unspecified chronicity pattern, unspecified headache type     Note: This dictation was prepared with Dragon dictation. Any transcriptional errors that result from this process are unintentional     Phineas Semen, MD 09/15/17 2029

## 2017-09-15 NOTE — ED Notes (Signed)
Nystagmus noted in both eyes when pt followed this nurse's finger.

## 2017-09-15 NOTE — Discharge Instructions (Addendum)
As we discussed please make sure you are taking breaks and eating during your work. Please seek medical attention for any high fevers, chest pain, shortness of breath, change in behavior, persistent vomiting, bloody stool or any other new or concerning symptoms.

## 2017-09-15 NOTE — ED Notes (Signed)
Pt reports that he has had intermittent chest pain, numbness in the left side of his face, "stumbling," and difficulty finding his words since Thursday.  Pt alert & oriented with NAD noted.

## 2017-09-15 NOTE — ED Notes (Signed)
Patient ambulatory to lobby with steady gait and NAD noted. Verbalized understanding of follow-up care and discharge instructions.

## 2017-09-15 NOTE — ED Triage Notes (Signed)
Pt reports last week he had been working really hard and then Thursday he was so sore he could not hardly move. Pt reports he then felt like he had some numbness and tingling in his hands. Pt reports may have gotten dehydrated but has been anxious about it. Pt reports feels alright now. Pt reports has some gastritis right now.

## 2017-10-30 DIAGNOSIS — E1122 Type 2 diabetes mellitus with diabetic chronic kidney disease: Secondary | ICD-10-CM | POA: Insufficient documentation

## 2018-10-30 ENCOUNTER — Other Ambulatory Visit: Payer: Self-pay

## 2018-10-30 ENCOUNTER — Encounter: Payer: Self-pay | Admitting: Gynecology

## 2018-10-30 ENCOUNTER — Ambulatory Visit
Admission: EM | Admit: 2018-10-30 | Discharge: 2018-10-30 | Disposition: A | Discharge status: Home / Self Care | Payer: BC Managed Care – PPO | Attending: Family Medicine | Admitting: Family Medicine

## 2018-10-30 DIAGNOSIS — R42 Dizziness and giddiness: Secondary | ICD-10-CM | POA: Diagnosis not present

## 2018-10-30 MED ORDER — LISINOPRIL-HYDROCHLOROTHIAZIDE 20-25 MG PO TABS: 1.0000 | ORAL_TABLET | Freq: Every day | ORAL | Status: AC

## 2018-10-30 MED ORDER — AMLODIPINE BESYLATE 5 MG PO TABS: 5.0000 mg | ORAL_TABLET | Freq: Every day | ORAL | 0 refills | Status: DC

## 2018-10-30 NOTE — Discharge Instructions (Signed)
Medications as listed.  Meclizine as needed. Epley maneuver may help.  If worsens or persists, please be re-evaluated.  Take care  Dr. Lacinda Axon

## 2018-10-30 NOTE — ED Provider Notes (Signed)
[facility]    CSN: [id number] Arrival date & time: 10/30/18  1601      History   Chief Complaint Chief Complaint  Patient presents with  . Dizziness   HPI  58 year old male presents with dizziness.  Patient reports a one-week history of dizziness.  He states that it is similar to prior episodes of vertigo that he has had.  Describes it as room spinning.  Worse with abrupt lying down or getting up from lying down.  He has recently had a medication change and has been taking medication appropriately.  Was recently started on lisinopril/HCTZ 20-25.  In addition to this, he has mistakingly been taking an additional 12.5 mg of HCTZ.  He has likely taken additional lisinopril as well but he states that he was told to stop by his pharmacy.  His other medications have remained the same.  He also reports recent nausea accompanying his this.  Also has had diarrhea.  No other complaints or concerns at this time.  PMH, Surgical Hx, Family Hx, Social History reviewed and updated as below.  Past Medical History:  Diagnosis Date  . Depression   . Diabetes mellitus without complication (HCC)   . Hypercholesteremia   . Hypertension   Morbid obesity Anxiety CKD  Past Surgical History:  Procedure Laterality Date  . CHOLECYSTECTOMY     Home Medications    Prior to Admission medications   Medication Sig Start Date End Date Taking? Authorizing Provider  atorvastatin (LIPITOR) 40 MG tablet Take 40 mg by mouth daily at 6 PM.  08/29/16  Yes [provider]  DULoxetine (CYMBALTA) 20 MG capsule Take by mouth. 10/20/18 10/20/19 Yes [provider]  FARXIGA 5 MG TABS tablet 5 mg daily.  08/20/16  Yes [provider]  glucose blood (ACCU-CHEK AVIVA PLUS) test strip Test daily before all meals/snacks and once before bedtime. 08/09/13  Yes [provider]  insulin NPH-regular Human (70-30) 100 UNIT/ML injection Inject 55 units before morning meal and 50 units  before evening meal. 03/06/18  Yes [provider]  metFORMIN (GLUCOPHAGE) 1000 MG tablet TAKE 1 TABLET BY MOUTH TWICE DAILY WITH MEALS. 12/14/14  Yes [provider]  metoprolol succinate (TOPROL-XL) 25 MG 24 hr tablet  10/19/18  Yes [provider]  hydrochlorothiazide (HYDRODIURIL) 12.5 MG tablet Take by mouth. 03/20/18 10/30/18 Yes [provider]  lisinopril (PRINIVIL,ZESTRIL) 5 MG tablet TAKE 1 TABLET BY MOUTH ONCE DAILY 04/17/15 10/30/18 Yes [provider]  amLODipine (NORVASC) 5 MG tablet Take 1 tablet (5 mg total) by mouth daily. 10/30/18   Tommie Samsook, Kamiryn Bezanson G, DO  glipiZIDE (GLUCOTROL XL) 5 MG 24 hr tablet Take 5 mg by mouth daily with breakfast.  09/09/16   [provider]  insulin glargine (LANTUS) 100 UNIT/ML injection Inject 15u subq BID 01/08/17 01/08/18  Willy Eddyobinson, Patrick, MD  lisinopril-hydrochlorothiazide (ZESTORETIC) 20-25 MG tablet Take 1 tablet by mouth daily. 10/30/18   Tommie Samsook, Jarae Nemmers G, DO  fluticasone (FLONASE) 50 MCG/ACT nasal spray Place 2 sprays into both nostrils daily as needed.  07/18/16 10/30/18  [provider]    Family History Family History  Problem Relation Age of Onset  . Thyroid disease Mother   . Cancer Mother   . Diabetes Mother     Social History Social History   Tobacco Use  . Smoking status: Former Games developermoker  . Smokeless tobacco: Never Used  Substance Use Topics  . Alcohol use: No  . Drug use: No  Allergies   Viagra [sildenafil citrate]   Review of Systems Review of Systems  Gastrointestinal: Positive for diarrhea and nausea.  Neurological: Positive for dizziness.   Physical Exam Triage Vital Signs ED Triage Vitals  Enc Vitals Group     BP 10/30/18 1616 133/84     Pulse Rate 10/30/18 1616 (!) 110     Resp 10/30/18 1616 16     Temp 10/30/18 1616 97.8 F (36.6 C)     Temp Source 10/30/18 1616 Oral     SpO2 10/30/18 1616 95 %     Weight 10/30/18 1613 280 lb (127 kg)     Height 10/30/18  1613 5\' 8"  (1.727 m)     Head Circumference --      Peak Flow --      Pain Score 10/30/18 1612 0     Pain Loc --      Pain Edu? --      Excl. in [place]? --    Updated Vital Signs BP 133/84 (BP Location: Left Arm)   Pulse (!) 110   Temp 97.8 F (36.6 C) (Oral)   Resp 16   Ht 5\' 8"  (1.727 m)   Wt 127 kg   SpO2 95%   BMI 42.57 kg/m   Visual Acuity Right Eye Distance:   Left Eye Distance:   Bilateral Distance:    Right Eye Near:   Left Eye Near:    Bilateral Near:     Physical Exam Vitals signs and nursing note reviewed.  Constitutional:      General: He is not in acute distress.    Appearance: Normal appearance.  HENT:     Head: Normocephalic and atraumatic.  Eyes:     General:        Right eye: No discharge.        Left eye: No discharge.     Conjunctiva/sclera: Conjunctivae normal.     Pupils: Pupils are equal, round, and reactive to light.     Comments: + Head impulse.  Cardiovascular:     Rate and Rhythm: Regular rhythm. Tachycardia present.  Pulmonary:     Effort: Pulmonary effort is normal.     Breath sounds: Normal breath sounds.  Neurological:     Mental Status: He is alert.  Psychiatric:        Mood and Affect: Mood normal.        Behavior: Behavior normal.    UC Treatments / Results  Labs (all labs ordered are listed, but only abnormal results are displayed) Labs Reviewed - No data to display  EKG None  Radiology No results found.  Procedures Procedures (including critical care time)  Medications Ordered in UC Medications - No data to display  Initial Impression / Assessment and Plan / UC Course  I have reviewed the triage vital signs and the nursing notes.  Pertinent labs & imaging results that were available during my care of the patient were reviewed by me and considered in my medical decision making (see chart for details).    58 year old male presents with dizziness.  Suspect this is multifactorial: Vertigo, recent medication  changes and inappropriate taking of medications.  Medications have been reconciled today.  His duplicate medications have been disposed of.  Epley maneuver as directed.  Meclizine as needed.  Final Clinical Impressions(s) / UC Diagnoses   Final diagnoses:  Dizziness     Discharge Instructions     Medications as listed.  Meclizine as needed. Epley maneuver may  help.  If worsens or persists, please be re-evaluated.  Take care  Dr. Adriana Simasook    ED Prescriptions    Medication Sig Dispense Auth. Provider   lisinopril-hydrochlorothiazide (ZESTORETIC) 20-25 MG tablet Take 1 tablet by mouth daily.  Grettel Rames G, DO   amLODipine (NORVASC) 5 MG tablet Take 1 tablet (5 mg total) by mouth daily.  Tommie Samsook, Cheyrl Buley G, DO     Controlled Substance Prescriptions [place] Controlled Substance Registry consulted? Not Applicable   Tommie SamsCook, Imanni Burdine G, [place] 10/30/18 1648

## 2018-10-30 NOTE — ED Triage Notes (Signed)
Per patient with dizziness x 1 week. Per patient fell x yesterday after getting out of bed.Per patient question new blood pressure medication Lisinopril - HCTZ. 20-25 mg.  Per patient was taken Lisinopril 20 mg before change.

## 2018-12-07 ENCOUNTER — Other Ambulatory Visit: Payer: Self-pay | Admitting: Nephrology

## 2018-12-07 DIAGNOSIS — N183 Chronic kidney disease, stage 3 unspecified: Secondary | ICD-10-CM

## 2018-12-14 ENCOUNTER — Other Ambulatory Visit: Payer: Self-pay | Admitting: Nephrology

## 2018-12-14 DIAGNOSIS — N183 Chronic kidney disease, stage 3 unspecified: Secondary | ICD-10-CM

## 2019-05-04 DIAGNOSIS — N2581 Secondary hyperparathyroidism of renal origin: Secondary | ICD-10-CM | POA: Insufficient documentation

## 2019-06-28 DIAGNOSIS — Z794 Long term (current) use of insulin: Secondary | ICD-10-CM | POA: Insufficient documentation

## 2021-04-22 ENCOUNTER — Other Ambulatory Visit: Payer: Self-pay

## 2021-04-22 ENCOUNTER — Ambulatory Visit
Admission: EM | Admit: 2021-04-22 | Discharge: 2021-04-22 | Disposition: A | Discharge status: Home / Self Care | Payer: BC Managed Care – PPO | Attending: Physician Assistant | Admitting: Physician Assistant

## 2021-04-22 ENCOUNTER — Ambulatory Visit (INDEPENDENT_AMBULATORY_CARE_PROVIDER_SITE_OTHER): Payer: BC Managed Care – PPO

## 2021-04-22 DIAGNOSIS — M5442 Lumbago with sciatica, left side: Secondary | ICD-10-CM

## 2021-04-22 DIAGNOSIS — M545 Low back pain, unspecified: Secondary | ICD-10-CM

## 2021-04-22 MED ORDER — TIZANIDINE HCL 4 MG PO CAPS: 4.0000 mg | ORAL_CAPSULE | Freq: Three times a day (TID) | ORAL | 0 refills | Status: DC | PRN

## 2021-04-22 NOTE — Discharge Instructions (Signed)
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  You may need to get an MRI in the future so recommend you follow-up with your primary care if symptoms or not improving.  I have called in a muscle relaxer that you can take up to 3 times a day as needed.  This will make you sleepy so do not drive or drink alcohol while taking it.  You should not take it and go to work.  You can use over-the-counter medication including Tylenol.  Use heat, rest, stretch for additional symptom relief.  If you develop any severe symptoms, worsening pain, difficulty walking, going to the bathroom understaffed without noticing it, numbness or tingling in your legs you need to be seen immediately.
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
## 2021-04-22 NOTE — ED Triage Notes (Signed)
Patient is here today for "Back Pain". Started "about a month ago". Progressively getting worse. No injury known. Seems to be mostly "Left lower back, with radiation to buttocks and leg".

## 2021-08-15 ENCOUNTER — Other Ambulatory Visit: Payer: Self-pay

## 2021-08-15 ENCOUNTER — Ambulatory Visit
Admission: EM | Admit: 2021-08-15 | Discharge: 2021-08-15 | Disposition: A | Discharge status: Home / Self Care | Payer: BC Managed Care – PPO | Attending: Emergency Medicine | Admitting: Emergency Medicine

## 2021-08-15 DIAGNOSIS — R040 Epistaxis: Secondary | ICD-10-CM | POA: Insufficient documentation

## 2021-08-15 LAB — CBC WITH DIFFERENTIAL/PLATELET
Abs Immature Granulocytes: 0.01 10*3/uL (ref 0.00–0.07)
Basophils Absolute: 0.1 10*3/uL (ref 0.0–0.1)
Basophils Relative: 2 %
Eosinophils Absolute: 0.3 10*3/uL (ref 0.0–0.5)
Eosinophils Relative: 7 %
HCT: 42.4 % (ref 39.0–52.0)
Hemoglobin: 14.5 g/dL (ref 13.0–17.0)
Immature Granulocytes: 0 %
Lymphocytes Relative: 27 %
Lymphs Abs: 1.3 10*3/uL (ref 0.7–4.0)
MCH: 29.7 pg (ref 26.0–34.0)
MCHC: 34.2 g/dL (ref 30.0–36.0)
MCV: 86.9 fL (ref 80.0–100.0)
Monocytes Absolute: 0.4 10*3/uL (ref 0.1–1.0)
Monocytes Relative: 9 %
Neutro Abs: 2.5 10*3/uL (ref 1.7–7.7)
Neutrophils Relative %: 55 %
Platelets: 220 10*3/uL (ref 150–400)
RBC: 4.88 MIL/uL (ref 4.22–5.81)
RDW: 13.3 % (ref 11.5–15.5)
WBC: 4.6 10*3/uL (ref 4.0–10.5)
nRBC: 0 % (ref 0.0–0.2)

## 2021-08-15 MED ORDER — FLUTICASONE PROPIONATE 50 MCG/ACT NA SUSP: 1.0000 | Freq: Every day | NASAL | 2 refills | Status: DC

## 2021-08-15 MED ORDER — OXYMETAZOLINE HCL 0.05 % NA SOLN: 1.0000 | Freq: Two times a day (BID) | NASAL | 0 refills | Status: DC

## 2021-08-15 MED ORDER — LORATADINE 10 MG PO TABS: 10.0000 mg | ORAL_TABLET | Freq: Every day | ORAL | 1 refills | Status: DC

## 2021-08-15 NOTE — Discharge Instructions (Signed)
Your nosebleeds are most likely occurring due to dryness of your airways which is being worsening by your nasal congestion and allergies  ? ?begin use of Claritin daily to help minimize secretions, this medication may not make you drowsy ? ?Continue use of Flonase 1-2 times a day, this is to help remove secretions from your sinus cavity ? ?May wear mask at work if there is going to be a known exposure to dust to help prevent exacerbation of symptoms ? ?You may use Afrin nasal spray if nosebleed recurs, if bleeding continues to persist after 2-3 uses of aspirin please follow-up at your nearest urgent care for evaluation ? ?To further help moisten your airways you may place petroleum jelly on a Q-tip and coat your inside of your nose 1-2 times daily ? ?You may sit in a steam bathroom prior to bedtime for 5 to 10 minutes ? ? ? ? ?

## 2021-08-15 NOTE — ED Provider Notes (Signed)
?[facility] ? ? ? ?CSN: [id number] ?Arrival date & time: 08/15/21  1049 ? ? ?  ? ?History   ?Chief Complaint ?Chief Complaint  ?Patient presents with  ? Epistaxis  ? Fatigue  ? ? ?HPI ?Erik Larsen is a 61 y.o. male.  ? ?Presents with nasal congestion, rhinorrhea and nosebleeds for 2 days.  Endorses that the nosebleeds typically last for about 15 minutes before resolution, has attempted to pinch nose but the blood continues to gush out .  Feels that his he is more fatigued and relates this possibly to the blood loss.  History of seasonal allergies, stopped antihistamine due to drowsiness, endorses that he took a dose of Flonase last night and has not had any nosebleeds since.  Denies coughing, shortness of breath, wheezing, ear pain or headaches, lightheaded, dizziness, syncope.  History  of diabetes, hypertension, hypercholesterolemia.  ? ?Past Medical History:  ?Diagnosis Date  ? Depression   ? Diabetes mellitus without complication (HCC)   ? Hypercholesteremia   ? Hypertension   ? ? ?There are no problems to display for this patient. ? ? ?Past Surgical History:  ?Procedure Laterality Date  ? CHOLECYSTECTOMY    ? ? ? ? ? ?Home Medications   ? ?Prior to Admission medications   ?Medication Sig Start Date End Date Taking? Authorizing Provider  ?amLODipine (NORVASC) 5 MG tablet Take 1 tablet (5 mg total) by mouth daily. 10/30/18   Tommie Sams, DO  ?atorvastatin (LIPITOR) 40 MG tablet Take 40 mg by mouth daily at 6 PM.  08/29/16   [provider]  ?DULoxetine (CYMBALTA) 20 MG capsule Take by mouth. 10/20/18 10/20/19  [provider]  ?FARXIGA 5 MG TABS tablet 5 mg daily.  08/20/16   [provider]  ?glucose blood (ACCU-CHEK AVIVA PLUS) test strip Test daily before all meals/snacks and once before bedtime. 08/09/13   [provider]  ?insulin glargine (LANTUS) 100 UNIT/ML injection Inject 15u subq BID 01/08/17 01/08/18  Willy Eddy, MD  ?insulin NPH-regular Human (70-30)  100 UNIT/ML injection Inject 55 units before morning meal and 50 units before evening meal. 03/06/18   [provider]  ?lisinopril-hydrochlorothiazide (ZESTORETIC) 20-25 MG tablet Take 1 tablet by mouth daily. 10/30/18   Tommie Sams, DO  ?metFORMIN (GLUCOPHAGE) 1000 MG tablet TAKE 1 TABLET BY MOUTH TWICE DAILY WITH MEALS. 12/14/14   [provider]  ?metoprolol succinate (TOPROL-XL) 25 MG 24 hr tablet  10/19/18   [provider]  ?tiZANidine (ZANAFLEX) 4 MG capsule Take 1 capsule (4 mg total) by mouth 3 (three) times daily as needed for muscle spasms. 04/22/21   Raspet, Noberto Retort, PA-C  ?fluticasone (FLONASE) 50 MCG/ACT nasal spray Place 2 sprays into both nostrils daily as needed.  07/18/16 10/30/18  [provider]  ?hydrochlorothiazide (HYDRODIURIL) 12.5 MG tablet Take by mouth. 03/20/18 10/30/18  [provider]  ?lisinopril (PRINIVIL,ZESTRIL) 5 MG tablet TAKE 1 TABLET BY MOUTH ONCE DAILY 04/17/15 10/30/18  [provider]  ? ? ?Family History ?Family History  ?Problem Relation Age of Onset  ? Thyroid disease Mother   ? Cancer Mother   ? Diabetes Mother   ? ? ?Social History ?Social History  ? ?Tobacco Use  ? Smoking status: Former  ? Smokeless tobacco: Never  ?Vaping Use  ? Vaping Use: Never used  ?Substance Use Topics  ? Alcohol use: No  ? Drug use: No  ? ? ? ?Allergies   ?Sildenafil, Gabapentin, and Viagra [  sildenafil citrate] ? ? ?Review of Systems ?Review of Systems ?Defer to HPI  ? ? ?Physical Exam ?Triage Vital Signs ?ED Triage Vitals  ?Enc Vitals Group  ?   BP 08/15/21 1106 (!) 147/74  ?   Pulse Rate 08/15/21 1106 67  ?   Resp 08/15/21 1106 18  ?   Temp 08/15/21 1106 98.4 ?F (36.9 ?C)  ?   Temp Source 08/15/21 1106 Oral  ?   SpO2 08/15/21 1106 97 %  ?   Weight --   ?   Height --   ?   Head Circumference --   ?   Peak Flow --   ?   Pain Score 08/15/21 1105 0  ?   Pain Loc --   ?   Pain Edu? --   ?   Excl. in GC? --   ? ?No data found. ? ?Updated Vital  Signs ?BP (!) 147/74 (BP Location: Left Arm)   Pulse 67   Temp 98.4 ?F (36.9 ?C) (Oral)   Resp 18   SpO2 97%  ? ?Visual Acuity ?Right Eye Distance:   ?Left Eye Distance:   ?Bilateral Distance:   ? ?Right Eye Near:   ?Left Eye Near:    ?Bilateral Near:    ? ?Physical Exam ?Constitutional:   ?   Appearance: Normal appearance.  ?HENT:  ?   Head: Normocephalic.  ?   Nose: Congestion present. No nasal tenderness.  ?   Right Turbinates: Swollen.  ?   Left Turbinates: Swollen.  ?   Comments: Dried blood noted in the bilateral turbinates, epistaxis has resolved ?Eyes:  ?   Extraocular Movements: Extraocular movements intact.  ?Pulmonary:  ?   Effort: Pulmonary effort is normal.  ?Skin: ?   General: Skin is warm and dry.  ?Neurological:  ?   Mental Status: He is alert and oriented to person, place, and time. Mental status is at baseline.  ?Psychiatric:     ?   Mood and Affect: Mood normal.     ?   Behavior: Behavior normal.  ? ? ? ?UC Treatments / Results  ?Labs ?(all labs ordered are listed, but only abnormal results are displayed) ?Labs Reviewed  ?CBC WITH DIFFERENTIAL/PLATELET  ? ? ?EKG ? ? ?Radiology ?No results found. ? ?Procedures ?Procedures (including critical care time) ? ?Medications Ordered in UC ?Medications - No data to display ? ?Initial Impression / Assessment and Plan / UC Course  ?I have reviewed the triage vital signs and the nursing notes. ? ?Pertinent labs & imaging results that were available during my care of the patient were reviewed by me and considered in my medical decision making (see chart for details). ? ?Epistaxis ? ?Bleeding has resolved and patient is in no signs of distress, CBC obtained, within normal limits, GU symptoms are most likely related to dryness to the nasal turbinates related to nasal congestion and allergies, recommended use of Claritin and discontinuing Zyrtec if drowsiness is occurring, recommended continued use of Flonase and prescribed Afrin for management of nosebleeds  if they are to recur, recommended petroleum jelly to the turbinates,  humidifier and steam bathroom to further help moisten the airways, may follow-up with urgent care as needed for persisting or reoccurring symptoms ? ?Final diagnoses:  ?None  ? ?Discharge Instructions   ?None ?  ? ?ED Prescriptions   ?None ?  ? ?PDMP not reviewed this encounter. ?  ?Valinda Hoar, NP ?08/15/21 1229 ? ?

## 2021-08-15 NOTE — ED Triage Notes (Signed)
Patient presents to Urgent Care with complaints of runny nose, nosebleeds x 2 days. He states these episodes last about 15 mins and reports about 1/2 pint. He states he began feeling fatigue x 1 day and concerned it may be related to the amount of blood he has lost. Treating nasal congestion with nasal congestion.  ?

## 2021-08-17 ENCOUNTER — Encounter
Admission: RE | Disposition: A | Discharge status: Home / Self Care | Payer: Self-pay | Source: Home / Self Care | Attending: Gastroenterology

## 2021-08-17 ENCOUNTER — Ambulatory Visit
Admission: RE | Admit: 2021-08-17 | Discharge: 2021-08-17 | Disposition: A | Discharge status: Home / Self Care | Payer: BC Managed Care – PPO | Attending: Gastroenterology | Admitting: Gastroenterology

## 2021-08-17 DIAGNOSIS — Z539 Procedure and treatment not carried out, unspecified reason: Secondary | ICD-10-CM | POA: Insufficient documentation

## 2021-08-17 HISTORY — DX: Hyperlipidemia, unspecified: E78.5

## 2021-08-17 HISTORY — DX: Anxiety disorder, unspecified: F41.9

## 2021-08-17 HISTORY — DX: Chronic kidney disease, unspecified: N18.9

## 2021-08-17 HISTORY — DX: Secondary hyperparathyroidism of renal origin: N25.81

## 2021-08-17 SURGERY — COLONOSCOPY WITH PROPOFOL
Anesthesia: General

## 2021-08-17 MED ORDER — SODIUM CHLORIDE 0.9 % IV SOLN: INTRAVENOUS | Status: DC

## 2021-08-17 NOTE — Progress Notes (Signed)
Erik Larsen will reschedule his procedure as he did not have someone to transport him home afterwards. ?

## 2021-10-11 ENCOUNTER — Encounter: Payer: Self-pay | Admitting: Emergency Medicine

## 2021-10-11 ENCOUNTER — Other Ambulatory Visit: Payer: Self-pay

## 2021-10-11 ENCOUNTER — Ambulatory Visit
Admission: EM | Admit: 2021-10-11 | Discharge: 2021-10-11 | Disposition: A | Discharge status: Home / Self Care | Payer: BC Managed Care – PPO | Attending: Emergency Medicine | Admitting: Emergency Medicine

## 2021-10-11 DIAGNOSIS — S91115A Laceration without foreign body of left lesser toe(s) without damage to nail, initial encounter: Secondary | ICD-10-CM | POA: Diagnosis not present

## 2021-10-11 MED ORDER — CEPHALEXIN 500 MG PO CAPS: 500.0000 mg | ORAL_CAPSULE | Freq: Four times a day (QID) | ORAL | 0 refills | Status: AC

## 2021-10-11 NOTE — Discharge Instructions (Signed)
Take the Keflex 4 times a day with food for 7 days.  After work each day remove your boots, wash and lotion your feet, and keep your left foot elevated as much as possible to help decrease swelling and aid in healing.  If you develop swelling, redness, drainage, or fever return for re-evaluation.

## 2021-10-11 NOTE — ED Triage Notes (Signed)
Left little to pain for one week.  Patient thinks he stepped on a plastic, laundry detergent cup.   Did not see any particular issue.  Patient is a diabetic and feet/toes are red underneath, moist.  Toenails dark

## 2021-10-11 NOTE — ED Provider Notes (Signed)
[facility]    CSN: [id number] Arrival date & time: 10/11/21  [id number]      History   Chief Complaint Chief Complaint  Patient presents with   Toe Pain    HPI Erik Larsen is a 61 y.o. male.   HPI  61 year old male here for evaluation of left toe pain.  Patient reports that approximately week ago he stepped on the plastic To the top of the laundry detergent and sustained a laceration to the under portion of his left fifth toe.  He states he came in because he is concerned about a wound on his foot because he has diabetes.  He has not been doing anything to treat it at home.  He does not wash or lotion his feet daily.  He denies any fever, redness, swelling or drainage.  His blood sugars have been running 90-150 in the mornings which is pretty normal for him.  Past Medical History:  Diagnosis Date   Anxiety    Chronic kidney disease    Depression    Diabetes mellitus without complication (HCC)    Hypercholesteremia    Hyperlipidemia    Hypertension    Secondary hyperparathyroidism of renal origin (HCC)     There are no problems to display for this patient.   Past Surgical History:  Procedure Laterality Date   CHOLECYSTECTOMY         Home Medications    Prior to Admission medications   Medication Sig Start Date End Date Taking? Authorizing Provider  cephALEXin (KEFLEX) 500 MG capsule Take 1 capsule (500 mg total) by mouth 4 (four) times daily for 7 days. 10/11/21 10/18/21 Yes Becky Augustayan, Paulino Cork, NP  atorvastatin (LIPITOR) 40 MG tablet Take 40 mg by mouth daily at 6 PM.  08/29/16   [provider]  DULoxetine (CYMBALTA) 20 MG capsule Take by mouth. 10/20/18 10/20/19  [provider]  FARXIGA 5 MG TABS tablet 5 mg daily.  08/20/16   [provider]  fluticasone (FLONASE) 50 MCG/ACT nasal spray Place 1 spray into both nostrils daily. 08/15/21   White, Elita BooneAdrienne R, NP  glucose blood (ACCU-CHEK AVIVA PLUS) test strip Test daily before all  meals/snacks and once before bedtime. 08/09/13   [provider]  insulin glargine (LANTUS) 100 UNIT/ML injection Inject 15u subq BID 01/08/17 01/08/18  Willy Eddyobinson, Patrick, MD  insulin NPH-regular Human (70-30) 100 UNIT/ML injection Inject 55 units before morning meal and 50 units before evening meal. 03/06/18   [provider]  lisinopril-hydrochlorothiazide (ZESTORETIC) 20-25 MG tablet Take 1 tablet by mouth daily. 10/30/18   Tommie Samsook, Jayce G, DO  loratadine (CLARITIN) 10 MG tablet Take 1 tablet (10 mg total) by mouth daily. 08/15/21   White, Elita BooneAdrienne R, NP  metFORMIN (GLUCOPHAGE) 1000 MG tablet TAKE 1 TABLET BY MOUTH TWICE DAILY WITH MEALS. 12/14/14   [provider]  metoprolol succinate (TOPROL-XL) 25 MG 24 hr tablet  10/19/18   [provider]  oxymetazoline (AFRIN NASAL SPRAY) 0.05 % nasal spray Place 1 spray into both nostrils 2 (two) times daily. 08/15/21   White, Elita BooneAdrienne R, NP  tiZANidine (ZANAFLEX) 4 MG capsule Take 1 capsule (4 mg total) by mouth 3 (three) times daily as needed for muscle spasms. Patient not taking: Reported on 10/11/2021 04/22/21   Raspet, Noberto RetortErin K, PA-C  hydrochlorothiazide (HYDRODIURIL) 12.5 MG tablet Take by mouth. 03/20/18 10/30/18  [provider]  lisinopril (PRINIVIL,ZESTRIL) 5 MG tablet TAKE 1 TABLET BY MOUTH ONCE DAILY 04/17/15 10/30/18  [provider]    Family History Family History  Problem Relation Age of Onset   Thyroid disease Mother    Cancer Mother    Diabetes Mother     Social History Social History   Tobacco Use   Smoking status: Former   Smokeless tobacco: Never  Building services engineer Use: Never used  Substance Use Topics   Alcohol use: No   Drug use: No     Allergies   Sildenafil, Gabapentin, and Viagra [sildenafil citrate]   Review of Systems Review of Systems  Constitutional:  Negative for fever.  Musculoskeletal:  Positive for myalgias.  Skin:  Positive for wound. Negative for color  change.  Hematological: Negative.      Physical Exam Triage Vital Signs ED Triage Vitals  Enc Vitals Group     BP 10/11/21 0951 136/79     Pulse Rate 10/11/21 0951 89     Resp 10/11/21 0951 20     Temp 10/11/21 0951 98.1 F (36.7 C)     Temp Source 10/11/21 0951 Oral     SpO2 10/11/21 0951 95 %     Weight --      Height --      Head Circumference --      Peak Flow --      Pain Score 10/11/21 0948 5     Pain Loc --      Pain Edu? --      Excl. in [place]? --    No data found.  Updated Vital Signs BP 136/79 (BP Location: Left Arm) Comment (BP Location): large cuff  Pulse 89   Temp 98.1 F (36.7 C) (Oral)   Resp 20   SpO2 95%   Visual Acuity Right Eye Distance:   Left Eye Distance:   Bilateral Distance:    Right Eye Near:   Left Eye Near:    Bilateral Near:     Physical Exam Vitals and nursing note reviewed.  Constitutional:      Appearance: Normal appearance. He is not ill-appearing.  Musculoskeletal:        General: Tenderness and signs of injury present. No swelling or deformity. Normal range of motion.  Skin:    General: Skin is warm and dry.     Capillary Refill: Capillary refill takes less than 2 seconds.     Findings: No erythema or rash.  Neurological:     General: No focal deficit present.     Mental Status: He is alert and oriented to person, place, and time.     Sensory: No sensory deficit.  Psychiatric:        Mood and Affect: Mood normal.        Behavior: Behavior normal.        Thought Content: Thought content normal.        Judgment: Judgment normal.      UC Treatments / Results  Labs (all labs ordered are listed, but only abnormal results are displayed) Labs Reviewed - No data to display  EKG   Radiology No results found.  Procedures Procedures (including critical care time)  Medications Ordered in UC Medications - No data to display  Initial Impression / Assessment and Plan / UC Course  I have reviewed the triage vital  signs and the nursing notes.  Pertinent labs & imaging results that were available during my care of the patient were reviewed by me and considered in my medical decision making (see chart for details).  Patient is a nontoxic-appearing 61 year old male here for evaluation of pain in his left fifth toe that is been going on for a week.  It started after he stepped on the plastic To a laundry detergent bottle and he sustained a laceration to the underside of his toe.  He has not been doing anything at home to treat it because he says he cannot see it.  There has not been any drainage and patient without any fevers.  He has not had any elevations of his blood sugar and states that his fasting blood sugar in the morning is running 90-1 50, which is normal for him per his report.  On exam patient left foot is in normal anatomical position and is free of erythema, edema, or ecchymosis.  He does have dark thick toenails on his first and fifth toes.  The skin between his toes and on the sole of his foot is dry and flaky.  There is a small open wound at the plantar aspect of the left fifth toe.  There is no active bleeding.  The laceration is very superficial and only extends into the dermis.  No surrounding erythema or drainage.  Given that patient is diabetic I will place him on Keflex 4 times daily x7 days to prevent infection.  I have also advised him that when he is finished work he needs to take his boots off, wash his feet thoroughly, and lotion them to keep the skin supple.  He should inspect for any redness, drainage, or swelling.  If he does any fevers or the previous listed symptoms he should return for reevaluation.  Patient verbalizes understanding of same.   Final Clinical Impressions(s) / UC Diagnoses   Final diagnoses:  Laceration of lesser toe of left foot without foreign body present or damage to nail, initial encounter     Discharge Instructions      Take the Keflex 4 times a day with food  for 7 days.  After work each day remove your boots, wash and lotion your feet, and keep your left foot elevated as much as possible to help decrease swelling and aid in healing.  If you develop swelling, redness, drainage, or fever return for re-evaluation.     ED Prescriptions     Medication Sig Dispense Auth. Provider   cephALEXin (KEFLEX) 500 MG capsule Take 1 capsule (500 mg total) by mouth 4 (four) times daily for 7 days. 28 capsule Becky Augusta, NP      PDMP not reviewed this encounter.   Becky Augusta, NP 10/11/21 1041

## 2021-11-25 ENCOUNTER — Ambulatory Visit
Admission: EM | Admit: 2021-11-25 | Discharge: 2021-11-25 | Disposition: A | Discharge status: Home / Self Care | Payer: BC Managed Care – PPO | Attending: Family | Admitting: Family

## 2021-11-25 DIAGNOSIS — J3489 Other specified disorders of nose and nasal sinuses: Secondary | ICD-10-CM | POA: Insufficient documentation

## 2021-11-25 DIAGNOSIS — R7309 Other abnormal glucose: Secondary | ICD-10-CM | POA: Diagnosis present

## 2021-11-25 DIAGNOSIS — R0789 Other chest pain: Secondary | ICD-10-CM | POA: Diagnosis present

## 2021-11-25 DIAGNOSIS — I10 Essential (primary) hypertension: Secondary | ICD-10-CM | POA: Insufficient documentation

## 2021-11-25 DIAGNOSIS — R0602 Shortness of breath: Secondary | ICD-10-CM | POA: Diagnosis present

## 2021-11-25 LAB — COMPREHENSIVE METABOLIC PANEL
ALT: 29 U/L (ref 0–44)
AST: 24 U/L (ref 15–41)
Albumin: 4.2 g/dL (ref 3.5–5.0)
Alkaline Phosphatase: 69 U/L (ref 38–126)
Anion gap: 7 (ref 5–15)
BUN: 38 mg/dL — ABNORMAL HIGH (ref 6–20)
CO2: 24 mmol/L (ref 22–32)
Calcium: 9.6 mg/dL (ref 8.9–10.3)
Chloride: 105 mmol/L (ref 98–111)
Creatinine, Ser: 1.37 mg/dL — ABNORMAL HIGH (ref 0.61–1.24)
GFR, Estimated: 59 mL/min — ABNORMAL LOW (ref >60)
Glucose, Bld: 189 mg/dL — ABNORMAL HIGH (ref 70–99)
Potassium: 4.3 mmol/L (ref 3.5–5.1)
Sodium: 136 mmol/L (ref 135–145)
Total Bilirubin: 0.5 mg/dL (ref 0.3–1.2)
Total Protein: 7.5 g/dL (ref 6.5–8.1)

## 2021-11-25 LAB — TROPONIN I (HIGH SENSITIVITY): Troponin I (High Sensitivity): 3 ng/L (ref <18)

## 2021-11-25 NOTE — ED Provider Notes (Signed)
[facility]    CSN: [id number] Arrival date & time: 11/25/21  1135      History   Chief Complaint Chief Complaint  Patient presents with   Diarrhea   Shortness of Breath   Numbness    HPI Erik Larsen is a 61 y.o. male.   61 year old male presents with multiple concerns- started experiencing some shortness of breath and central chest pressure about 1 week ago. Had been lifting weights at the Gym about 2 weeks ago and did not go for a few days and then started experiencing these symptoms and has not returned to the GYM. No distinct injury when lifting or immediate pain. This pressure and shortness of breath usually occurs shortly before going to work which has been very stressful lately. Uncertain if symptoms are related to anxiety. He has has been experiencing some left facial numbness and central forehead pressure for the past week. Denies any distinct headache, vision changes or ear pain. Occasional dizziness. 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 Has mild post nasal drainage but no distinct cough. No nausea or vomiting. Has had some diarrhea again in the past week but has mostly resolved after drinking more water and taking Mylanta. Has appointment with his Diabetic specialist on August 1st with labwork. PCP is [facility] in [place]. Cardiologist is with [facility] whom he sees annually. Other chronic health issues include HTN, hyperlipidemia, environmental allergies, anxiety and depression. Currently on Lantus, Metformin, Insulin NPH-Regular 3 times a day, Farxiga, Lipitor, Zestoretic, Toprol XL, and Cymbalta. Has tried Claritin but causes drowsiness. Does have Flonase but has not used recently.   The history is provided by the patient.    Past Medical History:  Diagnosis Date   Anxiety    Chronic kidney disease    Depression    Diabetes  mellitus without complication (HCC)    Hypercholesteremia    Hyperlipidemia    Hypertension    Secondary hyperparathyroidism of renal origin (HCC)     There are no problems to display for this patient.   Past Surgical History:  Procedure Laterality Date   CHOLECYSTECTOMY         Home Medications    Prior to Admission medications   Medication Sig Start Date End Date Taking? Authorizing Provider  atorvastatin (LIPITOR) 40 MG tablet Take 40 mg by mouth daily at 6 PM.  08/29/16  Yes [provider]  FARXIGA 5 MG TABS tablet 5 mg daily.  08/20/16  Yes [provider]  fluticasone (FLONASE) 50 MCG/ACT nasal spray Place 1 spray into both nostrils daily. 08/15/21  Yes White, Elita Boone, NP  insulin NPH-regular Human (70-30) 100 UNIT/ML injection Inject 55 units before morning meal and 50 units before evening meal. 03/06/18  Yes [provider]  lisinopril-hydrochlorothiazide (ZESTORETIC) 20-25 MG tablet Take 1 tablet by mouth daily. 10/30/18  Yes Cook, Jayce G, DO  metFORMIN (GLUCOPHAGE) 1000 MG tablet TAKE 1 TABLET BY MOUTH TWICE DAILY WITH MEALS. 12/14/14  Yes [provider]  metoprolol succinate (TOPROL-XL) 25 MG 24 hr tablet  10/19/18  Yes [provider]  oxymetazoline (AFRIN NASAL SPRAY) 0.05 % nasal spray Place 1 spray into both nostrils 2 (two) times daily. 08/15/21  Yes White, Adrienne R, NP  DULoxetine (CYMBALTA) 20 MG capsule Take by mouth. 10/20/18 10/20/19  [provider]  insulin glargine (LANTUS) 100 UNIT/ML injection Inject 15u subq BID 01/08/17 01/08/18  Merlyn Lot, MD  hydrochlorothiazide (HYDRODIURIL) 12.5 MG tablet Take by mouth. 03/20/18 10/30/18  [provider]  lisinopril (PRINIVIL,ZESTRIL) 5 MG tablet TAKE 1 TABLET BY MOUTH ONCE DAILY 04/17/15 10/30/18  [provider]    Family History Family History  Problem Relation Age of Onset   Thyroid disease Mother    Cancer Mother    Diabetes Mother      Social History Social History   Tobacco Use   Smoking status: Former   Smokeless tobacco: Never  Scientific laboratory technician Use: Never used  Substance Use Topics   Alcohol use: No   Drug use: No     Allergies   Sildenafil, Gabapentin, and Viagra [sildenafil citrate]   Review of Systems Review of Systems  Constitutional:  Positive for activity change and fatigue. Negative for appetite change, chills, diaphoresis and fever.  HENT:  Positive for postnasal drip and sinus pressure. Negative for congestion, ear pain, facial swelling, sinus pain, sore throat and trouble swallowing.   Eyes:  Negative for photophobia, pain and visual disturbance.  Respiratory:  Positive for chest tightness and shortness of breath. Negative for cough and wheezing.   Cardiovascular:  Positive for chest pain. Negative for palpitations.  Gastrointestinal:  Positive for diarrhea. Negative for abdominal pain, nausea and vomiting.  Musculoskeletal:  Positive for back pain. Negative for arthralgias, myalgias, neck pain and neck stiffness.  Skin:  Negative for color change and rash.  Allergic/Immunologic: Positive for environmental allergies. Negative for food allergies.  Neurological:  Positive for dizziness, light-headedness and numbness. Negative for tremors, seizures, syncope, facial asymmetry and speech difficulty.  Hematological:  Negative for adenopathy. Does not bruise/bleed easily.  Psychiatric/Behavioral:  Positive for sleep disturbance. The patient is nervous/anxious.      Physical Exam Triage Vital Signs ED Triage Vitals  Enc Vitals Group     BP 11/25/21 1156 (!) 143/81     Pulse Rate 11/25/21 1156 77     Resp 11/25/21 1156 18     Temp 11/25/21 1156 98.1 F (36.7 C)     Temp Source 11/25/21 1156 Oral     SpO2 11/25/21 1156 96 %     Weight 11/25/21 1153 264 lb (119.7 kg)     Height 11/25/21 1153 5\' 8"  (1.727 m)     Head Circumference --      Peak Flow --      Pain Score 11/25/21 1153 0      Pain Loc --      Pain Edu? --      Excl. in [place]? --    No data found.  Updated Vital Signs BP (!) 143/81 (BP Location: Left Arm)   Pulse 77   Temp 98.1 F (36.7 C) (Oral)   Resp 18   Ht 5\' 8"  (1.727 m)   Wt 264 lb (119.7 kg)   SpO2 96%   BMI 40.14 kg/m   Visual Acuity Right Eye Distance:   Left Eye Distance:   Bilateral Distance:    Right Eye Near:   Left Eye Near:    Bilateral Near:     Physical Exam Vitals and nursing note reviewed.  Constitutional:      General: He is awake. He is not in acute distress.    Appearance: He is well-developed and well-groomed. He is not ill-appearing.     Comments: He is sitting comfortably in the exam chair in no  acute distress.   HENT:     Head: Normocephalic and atraumatic.     Right Ear: Hearing, tympanic membrane, ear canal and external ear normal.     Left Ear: Hearing, tympanic membrane, ear canal and external ear normal.     Nose: Nose normal.     Right Sinus: No maxillary sinus tenderness or frontal sinus tenderness.     Left Sinus: No maxillary sinus tenderness or frontal sinus tenderness.     Mouth/Throat:     Lips: Pink.     Mouth: Mucous membranes are moist.     Dentition: Abnormal dentition.     Pharynx: Oropharynx is clear. Uvula midline. No posterior oropharyngeal erythema or uvula swelling.  Eyes:     General: Lids are normal. Vision grossly intact.     Extraocular Movements: Extraocular movements intact.     Conjunctiva/sclera: Conjunctivae normal.     Pupils: Pupils are equal, round, and reactive to light.  Neck:     Vascular: No carotid bruit.  Cardiovascular:     Rate and Rhythm: Normal rate and regular rhythm.     Heart sounds: Normal heart sounds. No murmur heard. Pulmonary:     Effort: Pulmonary effort is normal. No respiratory distress.     Breath sounds: Normal breath sounds and air entry. No decreased air movement. No decreased breath sounds, wheezing, rhonchi or rales.  Musculoskeletal:         General: Normal range of motion.     Cervical back: Normal range of motion and neck supple.  Skin:    General: Skin is warm and dry.     Capillary Refill: Capillary refill takes less than 2 seconds.     Findings: No rash.  Neurological:     General: No focal deficit present.     Mental Status: He is alert and oriented to person, place, and time.     Cranial Nerves: Cranial nerves 2-12 are intact. No cranial nerve deficit or facial asymmetry.     Sensory: Sensation is intact. No sensory deficit.     Motor: Motor function is intact.  Psychiatric:        Attention and Perception: Attention normal.        Mood and Affect: Affect normal. Mood is anxious.        Speech: Speech normal.        Behavior: Behavior normal. Behavior is cooperative.        Thought Content: Thought content normal.        Cognition and Memory: Cognition normal.        Judgment: Judgment normal.      UC Treatments / Results  Labs (all labs ordered are listed, but only abnormal results are displayed) Labs Reviewed  COMPREHENSIVE METABOLIC PANEL - Abnormal; Notable for the following components:      Result Value   Glucose, Bld 189 (*)    BUN 38 (*)    Creatinine, Ser 1.37 (*)    GFR, Estimated 59 (*)    All other components within normal limits  TROPONIN I (HIGH SENSITIVITY)    EKG   Radiology No results found.  Procedures ED EKG  Date/Time: 11/25/2021 2:50 PM  Performed by: Sudie Grumbling, NP Authorized by: Sudie Grumbling, NP   ECG reviewed by ED Physician in the absence of a cardiologist: no   Previous ECG:    Previous ECG:  Compared to current   Similarity:  No change   Comparison ECG info:  Last ECG done 09/16/17 - also showed slight right bundle branch block but not listed in interepretation. Otherwise normal sinus rhythm Interpretation:    Interpretation: abnormal   Rate:    ECG rate:  69   ECG rate assessment: normal   Rhythm:    Rhythm: sinus rhythm   Ectopy:    Ectopy: none    QRS:    QRS conduction: RBBB   ST segments:    ST segments:  Non-specific Comments:     No significant changes when compared to previous ECG in May 2019. Also reviewed by Christene Slates, PA.   (including critical care time)  Medications Ordered in UC Medications - No data to display  Initial Impression / Assessment and Plan / UC Course  I have reviewed the triage vital signs and the nursing notes.  Pertinent labs & imaging results that were available during my care of the patient were reviewed by me and considered in my medical decision making (see chart for details).     Consulted with Christene Slates, PA. Obtained Chemistry profile which showed elevated glucose at 189. BUN elevated at 38 (previous BUN was 37 in April 2023) and Creatinine elevated at 1.37 (previous was 1.4 in April 2023)- no significant change. Troponin was negative.  Reviewed that he needs further evaluation of his symptoms but he is stable with no emergent need to go to the ER tonight. Recommend continue to monitor symptoms. Discussed that anxiety could cause some symptoms and environmental allergies could cause some sinus pressure. May try OTC Flonase once daily as directed. Continue to monitor blood pressure and sugar levels which could contribute to current symptoms. Recommend keep appointment with diabetic specialist for August 1st. Recommend call his PCP tomorrow AM to schedule appointment for further evaluation- may need to change and adjust medications. Encouraged to continue to push fluids/water. Note written for work. If shortness of breath, chest pressure worsens, increased dizziness or numbness occurs, go to the ER ASAP. Otherwise, follow-up with his PCP as planned.  Final Clinical Impressions(s) / UC Diagnoses   Final diagnoses:  Chest pressure  Shortness of breath  Sinus pressure  Elevated glucose level  Elevated blood pressure reading with diagnosis of hypertension     Discharge Instructions       Recommend continue to monitor symptoms. Recommend call your PCP tomorrow to schedule appointment for further evaluation. Continue to keep appointment with your diabetic specialist for August 1st. If shortness of breath worsens, any increased numbness, chest pain or dizziness occur, go to the ER ASAP. Otherwise, follow-up with your PCP as planned.     ED Prescriptions   None    PDMP not reviewed this encounter.   Katy Apo, NP 11/25/21 2139

## 2021-11-25 NOTE — ED Triage Notes (Signed)
Pt c/o SOB, diarrhea, left side facial numbness and pressure x1week.  Pt did heavy lifting at the gym and cardio.  Pt recently got a gym membership and stopped going last week and is unsure if his symptoms are due to that.

## 2021-11-25 NOTE — Discharge Instructions (Addendum)
Recommend continue to monitor symptoms. Recommend call your PCP tomorrow to schedule appointment for further evaluation. Continue to keep appointment with your diabetic specialist for August 1st. If shortness of breath worsens, any increased numbness, chest pain or dizziness occur, go to the ER ASAP. Otherwise, follow-up with your PCP as planned.

## 2022-02-05 ENCOUNTER — Ambulatory Visit
Admission: EM | Admit: 2022-02-05 | Discharge: 2022-02-05 | Disposition: A | Discharge status: Home / Self Care | Payer: BC Managed Care – PPO | Attending: Internal Medicine | Admitting: Internal Medicine

## 2022-02-05 DIAGNOSIS — E1165 Type 2 diabetes mellitus with hyperglycemia: Secondary | ICD-10-CM | POA: Insufficient documentation

## 2022-02-05 DIAGNOSIS — J019 Acute sinusitis, unspecified: Secondary | ICD-10-CM | POA: Insufficient documentation

## 2022-02-05 LAB — COMPREHENSIVE METABOLIC PANEL
ALT: 30 U/L (ref 0–44)
AST: 23 U/L (ref 15–41)
Albumin: 3.9 g/dL (ref 3.5–5.0)
Alkaline Phosphatase: 69 U/L (ref 38–126)
Anion gap: 8 (ref 5–15)
BUN: 44 mg/dL — ABNORMAL HIGH (ref 6–20)
CO2: 26 mmol/L (ref 22–32)
Calcium: 9.4 mg/dL (ref 8.9–10.3)
Chloride: 100 mmol/L (ref 98–111)
Creatinine, Ser: 1.63 mg/dL — ABNORMAL HIGH (ref 0.61–1.24)
GFR, Estimated: 48 mL/min — ABNORMAL LOW (ref >60)
Glucose, Bld: 214 mg/dL — ABNORMAL HIGH (ref 70–99)
Potassium: 3.8 mmol/L (ref 3.5–5.1)
Sodium: 134 mmol/L — ABNORMAL LOW (ref 135–145)
Total Bilirubin: <0.1 mg/dL — ABNORMAL LOW (ref 0.3–1.2)
Total Protein: 7.2 g/dL (ref 6.5–8.1)

## 2022-02-05 MED ORDER — AMOXICILLIN-POT CLAVULANATE 875-125 MG PO TABS: 1.0000 | ORAL_TABLET | Freq: Two times a day (BID) | ORAL | 0 refills | Status: DC

## 2022-02-05 MED ORDER — FLUTICASONE PROPIONATE 50 MCG/ACT NA SUSP: 1.0000 | Freq: Every day | NASAL | 0 refills | Status: DC

## 2022-02-05 NOTE — Discharge Instructions (Addendum)
Please increase oral fluid intake Please follow-up with the dietitian for dietary teaching We will call you with recommendations if labs are abnormal Return to urgent care if symptoms worsen. Humidifier and vapor rub use will help with nasal congestion.

## 2022-02-05 NOTE — ED Provider Notes (Signed)
[facility]    CSN: [id number] Arrival date & time: 02/05/22  7829      History   Chief Complaint Chief Complaint  Patient presents with   Nasal Congestion    HPI Erik Larsen is a 61 y.o. male with a history of diabetes mellitus type 2, uncontrolled comes to urgent care with 2-week history of nasal congestion, postnasal drainage and a cough.  Patient's symptoms started 2 weeks ago and has been persistent.  He denies any fever or chills.  No seasonal allergies.  No shortness of breath or chest pain.  No shortness of breath at rest or with activity.  No expiratory wheezing.  Patient denies exposure to toxic inhalational agents or cigarettes.  Patient is not a smoker.  Patient is a diabetic with poor control.  He attributes it to his dietary habits.  He denies any polyuria, polydipsia, polyphagia or weight loss.  No extremity numbness or tingling.  Patient is requesting dietary services to help him with his dietary habits.Marland Kitchen   HPI  Past Medical History:  Diagnosis Date   Anxiety    Chronic kidney disease    Depression    Diabetes mellitus without complication (HCC)    Hypercholesteremia    Hyperlipidemia    Hypertension    Secondary hyperparathyroidism of renal origin (HCC)     There are no problems to display for this patient.   Past Surgical History:  Procedure Laterality Date   CHOLECYSTECTOMY         Home Medications    Prior to Admission medications   Medication Sig Start Date End Date Taking? Authorizing Provider  amoxicillin-clavulanate (AUGMENTIN) 875-125 MG tablet Take 1 tablet by mouth every 12 (twelve) hours. 02/05/22  Yes Luddie Boghosian, Britta Mccreedy, MD  atorvastatin (LIPITOR) 40 MG tablet Take 40 mg by mouth daily at 6 PM.  08/29/16  Yes [provider]  fluticasone (FLONASE) 50 MCG/ACT nasal spray Place 1 spray into both nostrils daily. 02/05/22  Yes Aayat Hajjar, Britta Mccreedy, MD  insulin NPH-regular Human (70-30) 100 UNIT/ML injection Inject 55  units before morning meal and 50 units before evening meal. 03/06/18  Yes [provider]  lisinopril-hydrochlorothiazide (ZESTORETIC) 20-25 MG tablet Take 1 tablet by mouth daily. 10/30/18  Yes Cook, Jayce G, DO  metFORMIN (GLUCOPHAGE) 1000 MG tablet TAKE 1 TABLET BY MOUTH TWICE DAILY WITH MEALS. 12/14/14  Yes [provider]  metoprolol succinate (TOPROL-XL) 25 MG 24 hr tablet  10/19/18  Yes [provider]  oxymetazoline (AFRIN NASAL SPRAY) 0.05 % nasal spray Place 1 spray into both nostrils 2 (two) times daily. 08/15/21  Yes White, Adrienne R, NP  DULoxetine (CYMBALTA) 20 MG capsule Take by mouth. 10/20/18 10/20/19  [provider]  insulin glargine (LANTUS) 100 UNIT/ML injection Inject 15u subq BID 01/08/17 01/08/18  Willy Eddy, MD  hydrochlorothiazide (HYDRODIURIL) 12.5 MG tablet Take by mouth. 03/20/18 10/30/18  [provider]  lisinopril (PRINIVIL,ZESTRIL) 5 MG tablet TAKE 1 TABLET BY MOUTH ONCE DAILY 04/17/15 10/30/18  [provider]    Family History Family History  Problem Relation Age of Onset   Thyroid disease Mother    Cancer Mother    Diabetes Mother     Social History Social History   Tobacco Use   Smoking status: Former   Smokeless tobacco: Never  Building services engineer Use: Never used  Substance Use Topics   Alcohol use: No   Drug use: No     Allergies  Sildenafil, Gabapentin, and Viagra [sildenafil citrate]   Review of Systems Review of Systems As per HPI  Physical Exam Triage Vital Signs ED Triage Vitals  Enc Vitals Group     BP 02/05/22 1114 (!) 140/82     Pulse Rate 02/05/22 1114 82     Resp 02/05/22 1114 18     Temp 02/05/22 1114 98.2 F (36.8 C)     Temp Source 02/05/22 1114 Oral     SpO2 02/05/22 1114 97 %     Weight 02/05/22 1112 260 lb (117.9 kg)     Height 02/05/22 1112 5\' 8"  (1.727 m)     Head Circumference --      Peak Flow --      Pain Score 02/05/22 1111 3     Pain Loc --       Pain Edu? --      Excl. in [place]? --    No data found.  Updated Vital Signs BP (!) 140/82 (BP Location: Left Arm)   Pulse 82   Temp 98.2 F (36.8 C) (Oral)   Resp 18   Ht 5\' 8"  (1.727 m)   Wt 117.9 kg   SpO2 97%   BMI 39.53 kg/m   Visual Acuity Right Eye Distance:   Left Eye Distance:   Bilateral Distance:    Right Eye Near:   Left Eye Near:    Bilateral Near:     Physical Exam Vitals and nursing note reviewed.  Constitutional:      General: He is not in acute distress.    Appearance: He is not ill-appearing.  HENT:     Right Ear: Tympanic membrane normal.     Left Ear: Tympanic membrane normal.     Nose: No rhinorrhea.     Mouth/Throat:     Mouth: Mucous membranes are moist.     Pharynx: No posterior oropharyngeal erythema.  Eyes:     Extraocular Movements: Extraocular movements intact.     Conjunctiva/sclera: Conjunctivae normal.  Cardiovascular:     Rate and Rhythm: Normal rate and regular rhythm.     Pulses: Normal pulses.     Heart sounds: Normal heart sounds.  Pulmonary:     Effort: Pulmonary effort is normal.     Breath sounds: Normal breath sounds.  Abdominal:     General: Bowel sounds are normal.     Palpations: Abdomen is soft.  Neurological:     Mental Status: He is alert.      UC Treatments / Results  Labs (all labs ordered are listed, but only abnormal results are displayed) Labs Reviewed  COMPREHENSIVE METABOLIC PANEL    EKG   Radiology No results found.  Procedures Procedures (including critical care time)  Medications Ordered in UC Medications - No data to display  Initial Impression / Assessment and Plan / UC Course  I have reviewed the triage vital signs and the nursing notes.  Pertinent labs & imaging results that were available during my care of the patient were reviewed by me and considered in my medical decision making (see chart for details).     1.  Acute sinusitis greater than 10 days duration: Augmentin twice  daily for 7 days Flonase Humidifier use and vapor rub use Return precautions given No indication for COVID testing given the duration of the patient's symptoms  2.  Diabetes mellitus type 2-uncontrolled: Ambulatory referral to dietary services done Patient seems motivated to improve his dietary habits BMP ordered Return precautions given.  Final Clinical Impressions(s) / UC Diagnoses   Final diagnoses:  Acute sinusitis with symptoms greater than 10 days  Uncontrolled type 2 diabetes mellitus with hyperglycemia [facility])     Discharge Instructions      Please increase oral fluid intake Please follow-up with the dietitian for dietary teaching We will call you with recommendations if labs are abnormal Return to urgent care if symptoms worsen. Humidifier and vapor rub use will help with nasal congestion.    ED Prescriptions     Medication Sig Dispense Auth. Provider   amoxicillin-clavulanate (AUGMENTIN) 875-125 MG tablet Take 1 tablet by mouth every 12 (twelve) hours. 14 tablet Deklyn Gibbon, Britta Mccreedy, MD   fluticasone (FLONASE) 50 MCG/ACT nasal spray Place 1 spray into both nostrils daily. 16 g Elisa Kutner, Britta Mccreedy, MD      PDMP not reviewed this encounter.   Merrilee Jansky, MD 02/05/22 1323

## 2022-02-05 NOTE — ED Triage Notes (Signed)
Pt c/o nasal congestion, headaches x1week  Pt states that he feels "a heatwave going through my body"   Pt states that he has had high blood sugar and believes it is due to high blood sugar.   Pt would like to speak about a diet plan

## 2022-04-06 ENCOUNTER — Encounter: Payer: Self-pay | Admitting: Emergency Medicine

## 2022-04-06 ENCOUNTER — Ambulatory Visit
Admission: EM | Admit: 2022-04-06 | Discharge: 2022-04-06 | Disposition: A | Discharge status: Home / Self Care | Payer: BC Managed Care – PPO | Attending: Internal Medicine | Admitting: Internal Medicine

## 2022-04-06 DIAGNOSIS — J0100 Acute maxillary sinusitis, unspecified: Secondary | ICD-10-CM | POA: Diagnosis not present

## 2022-04-06 MED ORDER — DOXYCYCLINE HYCLATE 100 MG PO TABS: 100.0000 mg | ORAL_TABLET | Freq: Two times a day (BID) | ORAL | 0 refills | Status: DC

## 2022-04-06 NOTE — Discharge Instructions (Signed)
You were seen today for sinus symptoms.  I am diagnosing you with a sinus infection.  I am putting you on antibiotics twice daily for the next 10 days.  Please use your nasal spray as previously prescribed.  I provided you with a work note.  Please follow-up with your PCP if symptoms persist or worsen.

## 2022-04-06 NOTE — ED Provider Notes (Signed)
[facility]    CSN: [id number] Arrival date & time: 04/06/22  1216      History   Chief Complaint Chief Complaint  Patient presents with   Cough   Dizziness    HPI Erik Larsen is a 61 y.o. male with a history of HTN, HLD, CKD, DM 2 and hyperparathyroidism presents to UC today with complaint of left-sided headache, left-sided facial pain and pressure, left ear pain, cough and shortness of breath.  He reports this started 1 week ago.  He reports he is not blowing much mucus out of his nose.  He denies ear drainage or loss of hearing.  The cough is productive of green mucus at times.  He has had a flare of vertigo after hitting his head on a piece of wood from a door frame.  He has not had any loss of consciousness.  He denies runny nose, sore throat, chest pain, nausea, vomiting, diarrhea or syncope.  He has tried Mucinex OTC with minimal relief of symptoms.  He has not had sick contacts that he is aware of.  He reports he has allergy medicine and nasal spray at home but does not take this because the allergy medicine makes him sleepy.  HPI  Past Medical History:  Diagnosis Date   Anxiety    Chronic kidney disease    Depression    Diabetes mellitus without complication (HCC)    Hypercholesteremia    Hyperlipidemia    Hypertension    Secondary hyperparathyroidism of renal origin (HCC)     There are no problems to display for this patient.   Past Surgical History:  Procedure Laterality Date   CHOLECYSTECTOMY         Home Medications    Prior to Admission medications   Medication Sig Start Date End Date Taking? Authorizing Provider  doxycycline (VIBRA-TABS) 100 MG tablet Take 1 tablet (100 mg total) by mouth 2 (two) times daily. 04/06/22  Yes Lorre Munroe, NP  atorvastatin (LIPITOR) 40 MG tablet Take 40 mg by mouth daily at 6 PM.  08/29/16   [provider]  DULoxetine (CYMBALTA) 20 MG capsule Take by mouth. 10/20/18 10/20/19  [provider]  fluticasone (FLONASE) 50 MCG/ACT nasal spray Place 1 spray into both nostrils daily. 02/05/22   Merrilee Jansky, MD  insulin glargine (LANTUS) 100 UNIT/ML injection Inject 15u subq BID 01/08/17 01/08/18  Willy Eddy, MD  insulin NPH-regular Human (70-30) 100 UNIT/ML injection Inject 55 units before morning meal and 50 units before evening meal. 03/06/18   [provider]  lisinopril-hydrochlorothiazide (ZESTORETIC) 20-25 MG tablet Take 1 tablet by mouth daily. 10/30/18   Tommie Sams, DO  metFORMIN (GLUCOPHAGE) 1000 MG tablet TAKE 1 TABLET BY MOUTH TWICE DAILY WITH MEALS. 12/14/14   [provider]  metoprolol succinate (TOPROL-XL) 25 MG 24 hr tablet  10/19/18   [provider]  oxymetazoline (AFRIN NASAL SPRAY) 0.05 % nasal spray Place 1 spray into both nostrils 2 (two) times daily. 08/15/21   Valinda Hoar, NP  hydrochlorothiazide (HYDRODIURIL) 12.5 MG tablet Take by mouth. 03/20/18 10/30/18  [provider]  lisinopril (PRINIVIL,ZESTRIL) 5 MG tablet TAKE 1 TABLET BY MOUTH ONCE DAILY 04/17/15 10/30/18  [provider]    Family History Family History  Problem Relation Age of Onset   Thyroid disease Mother    Cancer Mother    Diabetes Mother     Social History Social History   Tobacco Use  Smoking status: Former   Smokeless tobacco: Never  Building services engineer Use: Never used  Substance Use Topics   Alcohol use: No   Drug use: No     Allergies   Sildenafil, Gabapentin, and Viagra [sildenafil citrate]   Review of Systems Review of Systems  Constitutional:  Positive for fatigue. Negative for chills and fever.  HENT:  Positive for congestion, ear pain, sinus pressure, sinus pain and sneezing. Negative for rhinorrhea and sore throat.   Eyes:  Negative for pain and redness.  Respiratory:  Positive for cough and shortness of breath. Negative for chest tightness.   Cardiovascular:  Negative for chest pain and  palpitations.  Gastrointestinal:  Negative for diarrhea, nausea and vomiting.  Musculoskeletal:  Negative for myalgias.  Skin:  Negative for rash.  Allergic/Immunologic: Positive for environmental allergies.  Neurological:  Positive for dizziness and headaches. Negative for syncope and weakness.     Physical Exam Triage Vital Signs ED Triage Vitals  Enc Vitals Group     BP 04/06/22 1338 133/75     Pulse Rate 04/06/22 1338 68     Resp 04/06/22 1338 16     Temp 04/06/22 1338 97.6 F (36.4 C)     Temp Source 04/06/22 1338 Oral     SpO2 04/06/22 1338 96 %     Weight --      Height --      Head Circumference --      Peak Flow --      Pain Score 04/06/22 1337 4     Pain Loc --      Pain Edu? --      Excl. in [place]? --    No data found.  Updated Vital Signs BP 133/75 (BP Location: Right Arm)   Pulse 68   Temp 97.6 F (36.4 C) (Oral)   Resp 16   SpO2 96%      Physical Exam Constitutional:      Appearance: Normal appearance. He is obese.  HENT:     Head: Normocephalic.     Comments: Maxillary sinus pressure noted on the left.    Right Ear: Tympanic membrane, ear canal and external ear normal.     Left Ear: Tympanic membrane, ear canal and external ear normal.     Nose: Congestion present. No rhinorrhea.     Mouth/Throat:     Mouth: Mucous membranes are moist.     Pharynx: No oropharyngeal exudate or posterior oropharyngeal erythema.  Eyes:     Extraocular Movements: Extraocular movements intact.     Conjunctiva/sclera: Conjunctivae normal.     Pupils: Pupils are equal, round, and reactive to light.  Cardiovascular:     Rate and Rhythm: Normal rate and regular rhythm.  Pulmonary:     Effort: Pulmonary effort is normal.     Breath sounds: Normal breath sounds.  Lymphadenopathy:     Cervical: No cervical adenopathy.  Skin:    General: Skin is warm and dry.     Findings: No rash.  Neurological:     General: No focal deficit present.     Mental Status: He is alert  and oriented to person, place, and time.     Gait: Gait normal.      UC Treatments / Results   Medications Ordered in UC Medications - No data to display  Initial Impression / Assessment and Plan / UC Course  I have reviewed the triage vital signs and the nursing notes.  Pertinent  labs & imaging results that were available during my care of the patient were reviewed by me and considered in my medical decision making (see chart for details).    61 year old male with complaint of left-sided facial pain and pressure, headache, ear pain, nasal congestion, cough and chest congestion.  DDx include viral sinusitis, bacterial sinusitis, viral URI with cough, acute bronchitis, pneumonia.  Discussed with him that we would not test for COVID/flu given the duration of his symptoms as he would not qualify for antiviral therapy at this time.  Exam consistent with a bacterial sinusitis.  Would avoid steroids given his history of uncontrolled diabetes.  We will treat with Doxycycline 100 mg twice daily x10 days.  Advised him to start taking his nasal spray daily as prescribed.  Work note provided.  Advised him to follow-up if his symptoms persist or worsen  Final Clinical Impressions(s) / UC Diagnoses   Final diagnoses:  Acute non-recurrent maxillary sinusitis     Discharge Instructions      You were seen today for sinus symptoms.  I am diagnosing you with a sinus infection.  I am putting you on antibiotics twice daily for the next 10 days.  Please use your nasal spray as previously prescribed.  I provided you with a work note.  Please follow-up with your PCP if symptoms persist or worsen.     ED Prescriptions     Medication Sig Dispense Auth. Provider   doxycycline (VIBRA-TABS) 100 MG tablet Take 1 tablet (100 mg total) by mouth 2 (two) times daily. 20 tablet Lorre Munroe, NP      PDMP not reviewed this encounter.   Lorre Munroe, NP 04/06/22 1359

## 2022-04-06 NOTE — ED Triage Notes (Signed)
Pt report cough, congestion and sinus pain/pressure for a week. Took Mucinex.  Reports when gets up from laying down get dizzy-hx vertigo. Pt adds that has pressure in posterior head neck when gets dizzy.   Reports two weeks ago hit his head denies LOC.

## 2022-04-08 ENCOUNTER — Encounter: Payer: Self-pay | Admitting: *Deleted

## 2022-04-08 ENCOUNTER — Encounter: Payer: BC Managed Care – PPO | Attending: Internal Medicine | Admitting: *Deleted

## 2022-04-08 VITALS — BP 128/68 | Ht 68.0 in | Wt 267.1 lb

## 2022-04-08 DIAGNOSIS — Z794 Long term (current) use of insulin: Secondary | ICD-10-CM | POA: Insufficient documentation

## 2022-04-08 DIAGNOSIS — E1122 Type 2 diabetes mellitus with diabetic chronic kidney disease: Secondary | ICD-10-CM | POA: Diagnosis present

## 2022-04-08 DIAGNOSIS — N183 Chronic kidney disease, stage 3 unspecified: Secondary | ICD-10-CM | POA: Insufficient documentation

## 2022-04-08 DIAGNOSIS — E1165 Type 2 diabetes mellitus with hyperglycemia: Secondary | ICD-10-CM

## 2022-04-08 NOTE — Progress Notes (Signed)
Diabetes Self-Management Education  Visit Type: First/Initial  Appt. Start Time: 1325 Appt. End Time: 1245  04/08/2022  [name], identified by name and date of birth, is a 61 y.o. male with a diagnosis of Diabetes: Type 2.   ASSESSMENT  Blood pressure 128/68, height _0  (1.727 m), weight 267 lb 1.6 oz (121.2 kg). Body mass index is 40.61 kg/m.   Diabetes Self-Management Education - 04/08/22 1639       Visit Information   Visit Type First/Initial      Initial Visit   Diabetes Type Type 2    Date Diagnosed 30+ years    Are you currently following a meal plan? No    Are you taking your medications as prescribed? Yes      Health Coping   How would you rate your overall health? Poor      Psychosocial Assessment   Patient Belief/Attitude about Diabetes Defeat/Burnout    What is the hardest part about your diabetes right now, causing you the most concern, or is the most worrisome to you about your diabetes?   Making healty food and beverage choices    Self-care barriers None    Self-management support Doctor's office    Patient Concerns Nutrition/Meal planning;Glycemic Control;Monitoring;Healthy Lifestyle    Special Needs None    Preferred Learning Style Visual;Hands on    Learning Readiness Ready    How often do you need to have someone help you when you read instructions, pamphlets, or other written materials from your doctor or pharmacy? 1 - Never    What is the last grade level you completed in school? some college      Pre-Education Assessment   Patient understands the diabetes disease and treatment process. Needs Review    Patient understands incorporating nutritional management into lifestyle. Needs Review    Patient undertands incorporating physical activity into lifestyle. Needs Instruction    Patient understands using medications safely. Needs Review    Patient understands monitoring blood glucose, interpreting and using results Comprehends key points     Patient understands prevention, detection, and treatment of acute complications. Needs Review    Patient understands prevention, detection, and treatment of chronic complications. Compreheands key points    Patient understands how to develop strategies to address psychosocial issues. Needs Review    Patient understands how to develop strategies to promote health/change behavior. Needs Review      Complications   Last HgB A1C per patient/outside source 8.9 %   03/21/2022   How often do you check your blood sugar? 3-4 times/day   FreeStyle Libre 2 - reading for 14, 30 and 90 days show Above Target - 47%, In Target - 53% and Below Target - 0%. Avg BG 183 mg/dL.   Number of hypoglycemic episodes per month 1   Today he had a reading of 57 mg/dL when he woke up at 12 noon. He had protein and no carbs at 7:30 am after getting off work and took his NovoLog.   Can you tell when your blood sugar is low? Yes   "felt dizzy"   What do you do if your blood sugar is low? "ate peanut butter then peanut butter sandwich and honey"    Have you had a dilated eye exam in the past 12 months? No    Have you had a dental exam in the past 12 months? No    Are you checking your feet? Yes    How many days per  week are you checking your feet? 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; eggs, steak, grits, hashbrowns    Snack (morning) 1 am - peanut butter crackers    Lunch 2-4 pm - meat sandwich, meat and starch - potatoes, chicken wings    Dinner 4:00 am - bbq, pork ribs, mac-n-cheese, foods from a deli, [employer], potatoes, pintos, tomatoes, onions    Snack (evening) 11 pm - peanut butter crackers    Beverage(s) water, coffee with Splenda, diet soda, Gatorade zero      Activity / Exercise   Activity / Exercise Type ADL's      Patient Education   Previous Diabetes Education Yes (please comment)   he came to classes here "years ago"   Disease Pathophysiology Explored  patient's options for treatment of their diabetes    Healthy Eating Role of diet in the treatment of diabetes and the relationship between the three main macronutrients and blood glucose level;Food label reading, portion sizes and measuring food.;Carbohydrate counting;Reviewed blood glucose goals for pre and post meals and how to evaluate the patients' food intake on their blood glucose level.;Meal timing in regards to the patients' current diabetes medication.;Information on hints to eating out and maintain blood glucose control.    Being Active Role of exercise on diabetes management, blood pressure control and cardiac health.    Medications Taught/reviewed insulin/injectables, injection, site rotation, insulin/injectables storage and needle disposal.;Reviewed patients medication for diabetes, action, purpose, timing of dose and side effects; Discussed not giving all 84 units in 1 area of abdomen and giving 42 units in one area and the other 42 units in another area.    Monitoring Taught/evaluated CGM (comment);Taught/discussed recording of test results and interpretation of SMBG.;Identified appropriate SMBG and/or A1C goals.;Yearly dilated eye exam;Scan Libre at least 4 x day    Acute complications Taught prevention, symptoms, and  treatment of hypoglycemia - the 15 rule.    Chronic complications Relationship between chronic complications and blood glucose control    Diabetes Stress and Support Identified and addressed patients feelings and concerns about diabetes      Individualized Goals (developed by patient)   Reducing Risk Other (comment)   improve blood sugars, prevent diabetes complications, lead a healthier lifestyle, become more fit     Outcomes   Expected Outcomes Demonstrated interest in learning. Expect positive outcomes    Future DMSE 4-6 wks         Individualized Plan for Diabetes Self-Management Training:   Learning Objective:  Patient will have a greater understanding of  diabetes self-management. Patient education plan is to attend individual and/or group sessions per assessed needs and concerns.   Plan:   Patient Instructions  Check blood sugars 4 x day before each meal and before bed every day using FreeStyle Libre Bring blood sugar records (FreeStyle reader) to the next appointment  Exercise:  Begin walking for    15  minutes   3  days a week and gradually increase to 150 minutes/week  Eat 3 meals day,   2  snacks a day Space meals 4-6 hours apart Decrease amount of artificially sweetened drinks (2 servings daily instead of 4-5) Bring food labels to next appointment Complete 3 Day Food Record and bring to next appt  Make an eye doctor appointment  Carry fast acting glucose and a snack at all times Hold insulin pen in place for 5-10 seconds after  injection Rotate injection sites Per Dr Gabriel Carina - don't take NovoLog at 4 am if eating a low carbohydrate meal   Return for appointment on:  Tuesday May 21, 2022 at 3:15 pm with Freda Munro (nurse)  Expected Outcomes:  Demonstrated interest in learning. Expect positive outcomes  Education material provided:  General Meal Planning Guidelines Simple Meal Plan 3 Day Food Record Symptoms, causes and treatments of Hypoglycemia (Novo) Controlling your portion sizes ([place]) FreeStyle [place] AVS  If problems or questions, patient to contact team via:   Johny Drilling, RN, [place] [phone]  Future DSME appointment: 4-6 wks May 21, 2022 with this educator

## 2022-04-08 NOTE — Patient Instructions (Addendum)
Check blood sugars 4 x day before each meal and before bed every day using FreeStyle Libre Bring blood sugar records (FreeStyle reader) to the next appointment  Exercise:  Begin walking for    15  minutes   3  days a week and gradually increase to 150 minutes/week  Eat 3 meals day,   2  snacks a day Space meals 4-6 hours apart Decrease amount of artificially sweetened drinks (2 servings daily instead of 4-5) Bring food labels to next appointment Complete 3 Day Food Record and bring to next appt  Make an eye doctor appointment  Carry fast acting glucose and a snack at all times Hold insulin pen in place for 5-10 seconds after injection Rotate injection sites Per Dr Tedd Sias - don't take NovoLog at 4 am if eating a low carbohydrate meal   Return for appointment on:  Tuesday May 21, 2022 at 3:15 pm with Velna Hatchet (nurse)

## 2022-05-21 ENCOUNTER — Encounter: Payer: Self-pay | Admitting: *Deleted

## 2022-05-21 ENCOUNTER — Encounter: Payer: BC Managed Care – PPO | Attending: Internal Medicine | Admitting: *Deleted

## 2022-05-21 VITALS — BP 118/74 | Wt 264.4 lb

## 2022-05-21 DIAGNOSIS — Z794 Long term (current) use of insulin: Secondary | ICD-10-CM | POA: Diagnosis present

## 2022-05-21 DIAGNOSIS — E1165 Type 2 diabetes mellitus with hyperglycemia: Secondary | ICD-10-CM | POA: Diagnosis present

## 2022-05-21 NOTE — Progress Notes (Signed)
Diabetes Self-Management Education  Visit Type: Follow-up  Appt. Start Time: 1520 Appt. End Time: 1620  05/21/2022  [name], identified by name and date of birth, is a 62 y.o. male with a diagnosis of Diabetes: Type 2.   ASSESSMENT  Blood pressure 118/74, weight 264 lb 6.4 oz (119.9 kg). Body mass index is 40.2 kg/m.   Diabetes Self-Management Education - 05/21/22 1631       Visit Information   Visit Type Follow-up      Initial Visit   Diabetes Type Type 2      Complications   How often do you check your blood sugar? > 4 times/day   90 day avg 179 mg/dL; Above Target 45%, In [place] 55%, Below Target 0% - 81% captured with 7 scans/day   Number of hypoglycemic episodes per month 2    Can you tell when your blood sugar is low? Yes    What do you do if your blood sugar is low? he drank 20 oz soda and then BG went above 400 mg/dL    Have you had a dilated eye exam in the past 12 months? No   has appt in February   Have you had a dental exam in the past 12 months? No    Are you checking your feet? Yes    How many days per week are you checking your feet? 2      Dietary Intake   Breakfast see 3 Day Food Records - some meals balanced, others with high fat and carbs and some with no carbs; he has included many non-starchy vegetables and has decreased portions    Beverage(s) continues to have multiple servings of artificially sweetened drinks      Activity / Exercise   Activity / Exercise Type Light (walking / raking leaves)    How many days per week do you exercise? 1    How many minutes per day do you exercise? 30    Total minutes per week of exercise 30      Patient Education   Disease Pathophysiology Explored patient's options for treatment of their diabetes    Healthy Eating Role of diet in the treatment of diabetes and the relationship between the three main macronutrients and blood glucose level;Food label reading, portion sizes and measuring food.;Carbohydrate  counting;Reviewed blood glucose goals for pre and post meals and how to evaluate the patients' food intake on their blood glucose level.;Meal timing in regards to the patients' current diabetes medication.;Information on hints to eating out and maintain blood glucose control.   He forgot to bring food labels   Being Active Role of exercise on diabetes management, blood pressure control and cardiac health.    Medications Taught/reviewed insulin/injectables, injection, site rotation, insulin/injectables storage and needle disposal.;Reviewed patients medication for diabetes, action, purpose, timing of dose and side effects.    Monitoring Taught/evaluated CGM (comment);Taught/discussed recording of test results and interpretation of SMBG.;Identified appropriate SMBG and/or A1C goals.;Yearly dilated eye exam    Acute complications Taught prevention, symptoms, and  treatment of hypoglycemia - the 15 rule.    Chronic complications Relationship between chronic complications and blood glucose control    Diabetes Stress and Support Identified and addressed patients feelings and concerns about diabetes      Individualized Goals (developed by patient)   Nutrition Follow meal plan discussed    Physical Activity Exercise 3-5 times per week;30 minutes per day    Medications take my medication as prescribed  Monitoring  Test my blood glucose as discussed;Consistenly use CGM    Reducing Risk treat hypoglycemia with 15 grams of carbs if blood glucose less than 5m/dL      Post-Education Assessment   Patient understands the diabetes disease and treatment process. Comprehends key points    Patient understands incorporating nutritional management into lifestyle. Comprehends key points    Patient undertands incorporating physical activity into lifestyle. Comprehends key points    Patient understands using medications safely. Comphrehends key points    Patient understands monitoring blood glucose, interpreting and  using results Demonstrates understanding / competency    Patient understands prevention, detection, and treatment of acute complications. Needs Review    Patient understands prevention, detection, and treatment of chronic complications. Demonstrates understanding / competency    Patient understands how to develop strategies to address psychosocial issues. Comprehends key points    Patient understands how to develop strategies to promote health/change behavior. Comprehends key points      Outcomes   Expected Outcomes Demonstrated interest in learning. Expect positive outcomes    Program Status Completed      Subsequent Visit   Since your last visit have you continued or begun to take your medications as prescribed? Yes    Since your last visit have you had your blood pressure checked? Yes    Is your most recent blood pressure lower, unchanged, or higher since your last visit? Lower    Since your last visit have you experienced any weight changes? Loss    Weight Loss (lbs) 2.7    Since your last visit, are you checking your blood glucose at least once a day? Yes         Individualized Plan for Diabetes Self-Management Training:   Learning Objective:  Patient will have a greater understanding of diabetes self-management. Patient education plan is to attend individual and/or group sessions per assessed needs and concerns.   Plan:   Patient Instructions  Check blood sugars 4 x day before each meal and before bed (per 3rd shift routine) using FreeStyle Libre Take your FreeStyle reader to all MD appointments  Exercise:  Continue walking for    30  minutes   1  day a week and gradually increase to 30 minutes 5 x week  Eat 3 meals day,   1-2  snacks a day Space meals 4-6 hours apart Limit food high in fat (wings, mac-n-cheese, gravy) Decrease amount of artificially sweetened drinks to 2 servings/day Continue including non-starchy vegetables with your meals  Carry fast acting glucose  and a snack at all times Rotate injection sites  Expected Outcomes:  Demonstrated interest in learning. Expect positive outcomes  Education material provided:  Planning a Balanced Meal  If problems or questions, patient to contact team via:   SJohny Drilling RN, CRice CVarina([phone] Future DSME appointment:  PRN

## 2022-05-21 NOTE — Patient Instructions (Signed)
Check blood sugars 4 x day before each meal and before bed (per 3rd shift routine) using FreeStyle Libre Take your FreeStyle reader to all MD appointments  Exercise:  Continue walking for    30  minutes   1  day a week and gradually increase to 30 minutes 5 x week  Eat 3 meals day,   1-2  snacks a day Space meals 4-6 hours apart Limit food high in fat (wings, mac-n-cheese, gravy) Decrease amount of artificially sweetened drinks to 2 servings/day Continue including non-starchy vegetables with your meals  Carry fast acting glucose and a snack at all times Rotate injection sites

## 2022-05-27 ENCOUNTER — Other Ambulatory Visit: Payer: Self-pay

## 2022-05-27 ENCOUNTER — Emergency Department
Admission: EM | Admit: 2022-05-27 | Discharge: 2022-05-27 | Disposition: A | Discharge status: Home / Self Care | Payer: BC Managed Care – PPO | Attending: Emergency Medicine | Admitting: Emergency Medicine

## 2022-05-27 ENCOUNTER — Emergency Department: Payer: BC Managed Care – PPO

## 2022-05-27 ENCOUNTER — Encounter: Payer: Self-pay | Admitting: Emergency Medicine

## 2022-05-27 DIAGNOSIS — Z1152 Encounter for screening for COVID-19: Secondary | ICD-10-CM | POA: Insufficient documentation

## 2022-05-27 DIAGNOSIS — R040 Epistaxis: Secondary | ICD-10-CM | POA: Insufficient documentation

## 2022-05-27 LAB — CBC WITH DIFFERENTIAL/PLATELET
Abs Immature Granulocytes: 0.02 10*3/uL (ref 0.00–0.07)
Basophils Absolute: 0.1 10*3/uL (ref 0.0–0.1)
Basophils Relative: 2 %
Eosinophils Absolute: 0.5 10*3/uL (ref 0.0–0.5)
Eosinophils Relative: 9 %
HCT: 45.3 % (ref 39.0–52.0)
Hemoglobin: 15.2 g/dL (ref 13.0–17.0)
Immature Granulocytes: 0 %
Lymphocytes Relative: 31 %
Lymphs Abs: 1.8 10*3/uL (ref 0.7–4.0)
MCH: 29.5 pg (ref 26.0–34.0)
MCHC: 33.6 g/dL (ref 30.0–36.0)
MCV: 88 fL (ref 80.0–100.0)
Monocytes Absolute: 0.6 10*3/uL (ref 0.1–1.0)
Monocytes Relative: 10 %
Neutro Abs: 2.8 10*3/uL (ref 1.7–7.7)
Neutrophils Relative %: 48 %
Platelets: 216 10*3/uL (ref 150–400)
RBC: 5.15 MIL/uL (ref 4.22–5.81)
RDW: 13.3 % (ref 11.5–15.5)
WBC: 5.8 10*3/uL (ref 4.0–10.5)
nRBC: 0 % (ref 0.0–0.2)

## 2022-05-27 LAB — COMPREHENSIVE METABOLIC PANEL
ALT: 31 U/L (ref 0–44)
AST: 27 U/L (ref 15–41)
Albumin: 4.1 g/dL (ref 3.5–5.0)
Alkaline Phosphatase: 67 U/L (ref 38–126)
Anion gap: 11 (ref 5–15)
BUN: 40 mg/dL — ABNORMAL HIGH (ref 8–23)
CO2: 25 mmol/L (ref 22–32)
Calcium: 8.7 mg/dL — ABNORMAL LOW (ref 8.9–10.3)
Chloride: 97 mmol/L — ABNORMAL LOW (ref 98–111)
Creatinine, Ser: 1.45 mg/dL — ABNORMAL HIGH (ref 0.61–1.24)
GFR, Estimated: 55 mL/min — ABNORMAL LOW (ref >60)
Glucose, Bld: 128 mg/dL — ABNORMAL HIGH (ref 70–99)
Potassium: 4.2 mmol/L (ref 3.5–5.1)
Sodium: 133 mmol/L — ABNORMAL LOW (ref 135–145)
Total Bilirubin: 0.5 mg/dL (ref 0.3–1.2)
Total Protein: 7.1 g/dL (ref 6.5–8.1)

## 2022-05-27 LAB — RESP PANEL BY RT-PCR (RSV, FLU A&B, COVID)  RVPGX2
Influenza A by PCR: NEGATIVE
Influenza B by PCR: NEGATIVE
Resp Syncytial Virus by PCR: NEGATIVE
SARS Coronavirus 2 by RT PCR: NEGATIVE

## 2022-05-27 NOTE — Discharge Instructions (Signed)
You can use a small amount of Vaseline to keep the nose moisturized. Please avoid nose sprays.

## 2022-05-27 NOTE — ED Provider Triage Note (Signed)
Emergency Medicine Provider Triage Evaluation Note  TREVIONNE ADVANI , a 62 y.o. male  was evaluated in triage.  Pt complains of cough, congestion, nosebleeds. Cough congestion x 1 week. Multiple nosebleeds after coughing. No bleeding currently.  Review of Systems  Positive: Cough, congestion, nosebleed Negative: Shob, CP, fever  Physical Exam  BP (!) 140/72   Pulse 85   Temp 97.7 F (36.5 C) (Oral)   Resp 18   SpO2 96%  Gen:   Awake, no distress   Resp:  Normal effort  MSK:   Moves extremities without difficulty  Other:    Medical Decision Making  Medically screening exam initiated at 3:53 PM.  Appropriate orders placed.  RAHMAN FERRALL was informed that the remainder of the evaluation will be completed by another provider, this initial triage assessment does not replace that evaluation, and the importance of remaining in the ED until their evaluation is complete.  Labs, chest xray, viral swab   Darletta Moll, PA-C 05/27/22 1553

## 2022-05-27 NOTE — ED Provider Notes (Signed)
[facility] Provider Note  Patient Contact: 6:31 PM (approximate)   History   Epistaxis   HPI  Erik Larsen is a 62 y.o. male presents to the emergency department with concern for intermittent epistaxis.  Patient states that his most recent episode of epistaxis occurred earlier today and lasted approximately 10 to 15 minutes.  Patient states that he has had a cold-like illness for approximately 1 week.  Has had some cough and congestion and states that after epistaxis, he spits up blood.  No chest pain, chest tightness or abdominal pain.      Physical Exam   Triage Vital Signs: ED Triage Vitals [05/27/22 1551]  Enc Vitals Group     BP (!) 140/72     Pulse Rate 85     Resp 18     Temp 97.7 F (36.5 C)     Temp Source Oral     SpO2 96 %     Weight      Height      Head Circumference      Peak Flow      Pain Score 0     Pain Loc      Pain Edu?      Excl. in [place]?     Most recent vital signs: Vitals:   05/27/22 1551  BP: (!) 140/72  Pulse: 85  Resp: 18  Temp: 97.7 F (36.5 C)  SpO2: 96%     Constitutional: Alert and oriented. Patient is lying supine. Eyes: Conjunctivae are normal. PERRL. EOMI. Head: Atraumatic. ENT:      Ears: Tympanic membranes are mildly injected with mild effusion bilaterally.       Nose: No congestion/rhinnorhea.  Patient has dried blood at right nare.      Mouth/Throat: Mucous membranes are moist. Posterior pharynx is mildly erythematous.  Hematological/Lymphatic/Immunilogical: No cervical lymphadenopathy.  Cardiovascular: Normal rate, regular rhythm. Normal S1 and S2.  Good peripheral circulation. Respiratory: Normal respiratory effort without tachypnea or retractions. Lungs CTAB. Good air entry to the bases with no decreased or absent breath sounds. Gastrointestinal: Bowel sounds 4 quadrants. Soft and nontender to palpation. No guarding or rigidity. No palpable masses. No distention. No CVA  tenderness. Musculoskeletal: Full range of motion to all extremities. No gross deformities appreciated. Neurologic:  Normal speech and language. No gross focal neurologic deficits are appreciated.  Skin:  Skin is warm, dry and intact. No rash noted. Psychiatric: Mood and affect are normal. Speech and behavior are normal. Patient exhibits appropriate insight and judgement.    ED Results / Procedures / Treatments   Labs (all labs ordered are listed, but only abnormal results are displayed) Labs Reviewed  COMPREHENSIVE METABOLIC PANEL - Abnormal; Notable for the following components:      Result Value   Sodium 133 (*)    Chloride 97 (*)    Glucose, Bld 128 (*)    BUN 40 (*)    Creatinine, Ser 1.45 (*)    Calcium 8.7 (*)    GFR, Estimated 55 (*)    All other components within normal limits  RESP PANEL BY RT-PCR (RSV, FLU A&B, COVID)  RVPGX2  CBC WITH DIFFERENTIAL/PLATELET      PROCEDURES:  Critical Care performed: No  Procedures   MEDICATIONS ORDERED IN ED: Medications - No data to display   IMPRESSION / MDM / [place] / ED COURSE  I reviewed the triage vital signs and the nursing notes.  Assessment and plan Epistaxis 62 year old male presents to the emergency department with resolved epistaxis.  Vital signs reassuring at triage.  On exam, patient was alert, active and nontoxic-appearing with no increased work of breathing.  No active bleeding on exam.   Patient had a reassuring H&H on CBC.  COVID and flu negative.  CMP indicates some mild hyponatremia but otherwise consistent with patient's baseline labs.  I recommended a small amount of Vaseline for nasal lubrication and to abstain from nasal sprays.  Work note was provided.  All patient questions were answered.  FINAL CLINICAL IMPRESSION(S) / ED DIAGNOSES   Final diagnoses:  Epistaxis     Rx / DC Orders   ED Discharge Orders     None        Note:  This  document was prepared using Dragon voice recognition software and may include unintentional dictation errors.   Vallarie Mare [place], PA-C 05/27/22 Velta Addison    Lavonia Drafts, MD 05/27/22 [phone]

## 2022-05-27 NOTE — ED Triage Notes (Signed)
Patient to ED for nosebleeds. No current bleeding. Had one earlier today and was spitting some up during the nose bleed. States over all not feeling well.

## 2022-05-29 ENCOUNTER — Emergency Department
Admission: EM | Admit: 2022-05-29 | Discharge: 2022-05-29 | Disposition: A | Discharge status: Home / Self Care | Payer: BC Managed Care – PPO | Attending: Emergency Medicine | Admitting: Emergency Medicine

## 2022-05-29 ENCOUNTER — Emergency Department: Payer: BC Managed Care – PPO

## 2022-05-29 ENCOUNTER — Encounter: Payer: Self-pay | Admitting: Emergency Medicine

## 2022-05-29 DIAGNOSIS — R531 Weakness: Secondary | ICD-10-CM | POA: Diagnosis present

## 2022-05-29 DIAGNOSIS — N189 Chronic kidney disease, unspecified: Secondary | ICD-10-CM | POA: Insufficient documentation

## 2022-05-29 DIAGNOSIS — I129 Hypertensive chronic kidney disease with stage 1 through stage 4 chronic kidney disease, or unspecified chronic kidney disease: Secondary | ICD-10-CM | POA: Insufficient documentation

## 2022-05-29 DIAGNOSIS — E1122 Type 2 diabetes mellitus with diabetic chronic kidney disease: Secondary | ICD-10-CM | POA: Insufficient documentation

## 2022-05-29 DIAGNOSIS — R55 Syncope and collapse: Secondary | ICD-10-CM | POA: Insufficient documentation

## 2022-05-29 LAB — BASIC METABOLIC PANEL
Anion gap: 6 (ref 5–15)
BUN: 51 mg/dL — ABNORMAL HIGH (ref 8–23)
CO2: 25 mmol/L (ref 22–32)
Calcium: 9 mg/dL (ref 8.9–10.3)
Chloride: 104 mmol/L (ref 98–111)
Creatinine, Ser: 1.56 mg/dL — ABNORMAL HIGH (ref 0.61–1.24)
GFR, Estimated: 50 mL/min — ABNORMAL LOW (ref >60)
Glucose, Bld: 246 mg/dL — ABNORMAL HIGH (ref 70–99)
Potassium: 4 mmol/L (ref 3.5–5.1)
Sodium: 135 mmol/L (ref 135–145)

## 2022-05-29 LAB — CBC
HCT: 43.9 % (ref 39.0–52.0)
Hemoglobin: 14.2 g/dL (ref 13.0–17.0)
MCH: 29 pg (ref 26.0–34.0)
MCHC: 32.3 g/dL (ref 30.0–36.0)
MCV: 89.6 fL (ref 80.0–100.0)
Platelets: 215 10*3/uL (ref 150–400)
RBC: 4.9 MIL/uL (ref 4.22–5.81)
RDW: 13.2 % (ref 11.5–15.5)
WBC: 5.6 10*3/uL (ref 4.0–10.5)
nRBC: 0 % (ref 0.0–0.2)

## 2022-05-29 LAB — URINALYSIS, ROUTINE W REFLEX MICROSCOPIC
Bilirubin Urine: NEGATIVE
Glucose, UA: 150 mg/dL — AB
Hgb urine dipstick: NEGATIVE
Ketones, ur: NEGATIVE mg/dL
Leukocytes,Ua: NEGATIVE
Nitrite: NEGATIVE
Protein, ur: NEGATIVE mg/dL
Specific Gravity, Urine: 1.015 (ref 1.005–1.030)
pH: 5 (ref 5.0–8.0)

## 2022-05-29 LAB — CBG MONITORING, ED: Glucose-Capillary: 238 mg/dL — ABNORMAL HIGH (ref 70–99)

## 2022-05-29 LAB — TROPONIN I (HIGH SENSITIVITY)
Troponin I (High Sensitivity): 5 ng/L (ref <18)
Troponin I (High Sensitivity): 4 ng/L (ref <18)

## 2022-05-29 MED ORDER — SODIUM CHLORIDE 0.9 % IV BOLUS
1000.0000 mL | Freq: Once | INTRAVENOUS | Status: AC
  Administered 2022-05-29: 1000 mL via INTRAVENOUS

## 2022-05-29 NOTE — Discharge Instructions (Signed)
Return to the ER for new, worsening or persistent weakness, lightheadedness, persistently low or high sugar levels, or any other new or worsening symptoms that concern you.  Take your insulin and other medications as prescribed and follow-up with your regular doctor.

## 2022-05-29 NOTE — ED Triage Notes (Signed)
Pt presents via POV with complaints of weakness and feeling "out of it" starting tonight. Pt states he had elevated CBGs over the last few hours, one level >300 and 290 after 22 units of insulin. Denies CP or SOB.    CBG-238 in triage.

## 2022-05-29 NOTE — ED Provider Notes (Signed)
[facility] Provider Note    Event Date/Time   First MD Initiated Contact with Patient 05/29/22 [phone]     (approximate)   History   Weakness   HPI  Erik Larsen is a 62 y.o. male with a history of hypertension, hyperlipidemia, CKD, type 2 diabetes, and hyperparathyroidism who presents with acute onset of lightheadedness today while he was working, described as feeling dizzy and like he might pass out.  He started to feel "out of it."  This lasted for several minutes and has now resolved.  The patient states he was recently evaluated for nosebleed which did not continue after he left the hospital.  He also states he recently had his insulin regimen changed and has noted a few low blood glucose readings.  He denies any chest pain, difficulty breathing, headache, vomiting or diarrhea, or any fever or chills.  I reviewed the past medical records.  The patient was seen in the ED 2 days ago for epistaxis which resolved.  His most recent outpatient encounter was with diabetes management on 1/16.   Physical Exam   Triage Vital Signs: ED Triage Vitals  Enc Vitals Group     BP 05/29/22 0112 136/72     Pulse Rate 05/29/22 0112 93     Resp 05/29/22 0112 18     Temp 05/29/22 0112 98 F (36.7 C)     Temp Source 05/29/22 0112 Oral     SpO2 05/29/22 0112 98 %     Weight 05/29/22 0108 263 lb 2.3 oz (119.4 kg)     Height 05/29/22 0108 5\' 8"  (1.727 m)     Head Circumference --      Peak Flow --      Pain Score 05/29/22 0108 0     Pain Loc --      Pain Edu? --      Excl. in [place]? --     Most recent vital signs: Vitals:   05/29/22 0518 05/29/22 0639  BP: 131/64 116/68  Pulse: 76 86  Resp: 16 17  Temp: 98.1 F (36.7 C)   SpO2: 93% 96%     General: Alert and oriented, comfortable appearing. CV:  Good peripheral perfusion.  Normal heart sounds. Resp:  Normal effort.  Lungs CTAB. Abd:  Soft and nontender.  No distention.  Other:  EOMI.  PERRLA.  No facial  droop.  Motor and sensory intact in all extremities.  No ataxia on finger-to-nose.  No pronator drift.  Slightly dry mucous membranes.   ED Results / Procedures / Treatments   Labs (all labs ordered are listed, but only abnormal results are displayed) Labs Reviewed  BASIC METABOLIC PANEL - Abnormal; Notable for the following components:      Result Value   Glucose, Bld 246 (*)    BUN 51 (*)    Creatinine, Ser 1.56 (*)    GFR, Estimated 50 (*)    All other components within normal limits  URINALYSIS, ROUTINE W REFLEX MICROSCOPIC - Abnormal; Notable for the following components:   Color, Urine STRAW (*)    APPearance CLEAR (*)    Glucose, UA 150 (*)    All other components within normal limits  CBG MONITORING, ED - Abnormal; Notable for the following components:   Glucose-Capillary 238 (*)    All other components within normal limits  CBC  TROPONIN I (HIGH SENSITIVITY)  TROPONIN I (HIGH SENSITIVITY)     EKG  ED ECG REPORT I,  Arta Silence, the attending physician, personally viewed and interpreted this ECG.  Date: 05/29/2022 EKG Time: 0117 Rate: 93 Rhythm: normal sinus rhythm QRS Axis: normal Intervals: Are been BB ST/T Wave abnormalities: normal Narrative Interpretation: no evidence of acute ischemia; no significant change when compared to EKG of 11/25/2021    RADIOLOGY  Chest x-ray: I independently viewed and interpreted the images; there is no focal consolidation or edema  PROCEDURES:  Critical Care performed: No  Procedures   MEDICATIONS ORDERED IN ED: Medications  sodium chloride 0.9 % bolus 1,000 mL (0 mLs Intravenous Stopped 05/29/22 0640)     IMPRESSION / MDM / ASSESSMENT AND PLAN / ED COURSE  I reviewed the triage vital signs and the nursing notes.  62 year old male with PMH as noted above presents with acute onset of near syncope today while at work, now resolved.  On exam the patient is well-appearing.  His vital signs are normal.   Neurologic exam is nonfocal and the physical exam is otherwise unremarkable.  Initial labs reveal an elevated glucose but normal anion gap.  Creatinine is minimally elevated but consistent with the patient's baseline.  Initial troponin is negative.  There is no leukocytosis or anemia.  Chest x-ray shows no acute findings.  Differential diagnosis includes, but is not limited to, vasovagal episode, dehydration/hypovolemia, hypoglycemia, other metabolic disturbance, viral syndrome.  I have low suspicion for cardiac etiology; we will repeat a troponin.  The patient has no urinary symptoms.  He had a respiratory panel that was negative when he was in the ED for Epistat this 2 days ago.  Patient's presentation is most consistent with acute presentation with potential threat to life or bodily function.  ----------------------------------------- 7:36 AM on 05/29/2022 -----------------------------------------  Repeat troponin is negative.  The patient is feeling significantly better after fluids.  He is stable for discharge home at this time.  I counseled him on the results of the workup and plan of care.  I gave him strict return precautions and he expressed understanding.   FINAL CLINICAL IMPRESSION(S) / ED DIAGNOSES   Final diagnoses:  Near syncope     Rx / DC Orders   ED Discharge Orders     None        Note:  This document was prepared using Dragon voice recognition software and may include unintentional dictation errors.    Arta Silence, MD 05/29/22 [phone]

## 2022-10-16 ENCOUNTER — Ambulatory Visit
Admission: EM | Admit: 2022-10-16 | Discharge: 2022-10-16 | Disposition: A | Discharge status: Home / Self Care | Payer: BC Managed Care – PPO | Attending: Physician Assistant | Admitting: Physician Assistant

## 2022-10-16 ENCOUNTER — Ambulatory Visit (INDEPENDENT_AMBULATORY_CARE_PROVIDER_SITE_OTHER): Payer: BC Managed Care – PPO

## 2022-10-16 DIAGNOSIS — R051 Acute cough: Secondary | ICD-10-CM

## 2022-10-16 DIAGNOSIS — J45909 Unspecified asthma, uncomplicated: Secondary | ICD-10-CM

## 2022-10-16 DIAGNOSIS — R0602 Shortness of breath: Secondary | ICD-10-CM

## 2022-10-16 MED ORDER — ALBUTEROL SULFATE HFA 108 (90 BASE) MCG/ACT IN AERS: 1.0000 | INHALATION_SPRAY | Freq: Four times a day (QID) | RESPIRATORY_TRACT | 0 refills | Status: DC | PRN

## 2022-10-16 MED ORDER — BENZONATATE 200 MG PO CAPS: 200.0000 mg | ORAL_CAPSULE | Freq: Three times a day (TID) | ORAL | 0 refills | Status: DC | PRN

## 2022-10-16 NOTE — ED Provider Notes (Signed)
[facility]    CSN: [id number] Arrival date & time: 10/16/22  5956      History   Chief Complaint Chief Complaint  Patient presents with   Shortness of Breath    HPI Erik Larsen is a 62 y.o. male  with a history of hypertension, hyperlipidemia, CKD, insulin dependent type 2 diabetes, and secondary hyperparathyroidism who presents with 1 week history of mostly dry but sometimes productive cough, fatigue, shortness of breath, facial pressure, and chest pressure.  Denies fever, sore throat, ear pain, vomiting or diarrhea. Denies nasal drainage. Denies any sick contacts.  Patient states he has been coughing so much it makes him feel lightheaded when he gets into coughing fits.  He has tried Claritin and Robitussin with some relief.  He states that he does work around a lot of chemicals at work and does not always wear a protective mask.  He wonders if he should be wearing a mask.  He says that sometimes the mist from the chemicals can trigger his symptoms.  He denies any history of asthma or reactive airway disease.  Denies smoking or alcohol use.  Denies any sick contacts or known exposure to COVID.  HPI  Past Medical History:  Diagnosis Date   Anxiety    Chronic kidney disease    Depression    Diabetes mellitus without complication (HCC)    Hypercholesteremia    Hyperlipidemia    Hypertension    Secondary hyperparathyroidism of renal origin (HCC)     There are no problems to display for this patient.   Past Surgical History:  Procedure Laterality Date   CHOLECYSTECTOMY         Home Medications    Prior to Admission medications   Medication Sig Start Date End Date Taking? Authorizing Provider  albuterol (VENTOLIN HFA) 108 (90 Base) MCG/ACT inhaler Inhale 1-2 puffs into the lungs every 6 (six) hours as needed for wheezing or shortness of breath. 10/16/22  Yes Shirlee Latch, PA-C  benzonatate (TESSALON) 200 MG capsule Take 1 capsule (200 mg total) by  mouth 3 (three) times daily as needed for cough. 10/16/22  Yes Eusebio Friendly B, PA-C  amLODipine (NORVASC) 5 MG tablet Take 1 tablet by mouth daily. 04/17/15   [provider]  atorvastatin (LIPITOR) 10 MG tablet Take 10 mg by mouth daily.    [provider]  Continuous Blood Gluc Sensor (FREESTYLE LIBRE 2 SENSOR) MISC  01/30/22   [provider]  doxycycline (VIBRA-TABS) 100 MG tablet Take 1 tablet (100 mg total) by mouth 2 (two) times daily. 04/06/22   Lorre Munroe, NP  DULoxetine (CYMBALTA) 20 MG capsule Take 20 mg by mouth daily.    [provider]  fluticasone (FLONASE) 50 MCG/ACT nasal spray Place 1 spray into both nostrils daily. 02/05/22   Lamptey, Britta Mccreedy, MD  insulin glargine, 2 Unit Dial, (TOUJEO MAX SOLOSTAR) 300 UNIT/ML Solostar Pen Inject 78 Units into the skin daily. 05/08/22   [provider]  Insulin Pen Needle (BD PEN NEEDLE NANO U/F) 32G X 4 MM MISC Use 1 each once daily E11.65 02/17/17   [provider]  ipratropium (ATROVENT) 0.06 % nasal spray Place 2 sprays into both nostrils 2 (two) times daily. 04/15/22   [provider]  lisinopril-hydrochlorothiazide (ZESTORETIC) 20-25 MG tablet Take 1 tablet by mouth daily. 10/30/18   Tommie Sams, DO  metFORMIN (GLUCOPHAGE) 1000 MG tablet TAKE 1 TABLET BY MOUTH TWICE DAILY WITH MEALS.  12/14/14   [provider]  metoprolol succinate (TOPROL-XL) 25 MG 24 hr tablet 25 mg daily. 10/19/18   [provider]  NOVOLOG FLEXPEN 100 UNIT/ML FlexPen Inject 18-32 Units into the skin 3 (three) times daily with meals. Based on sliding scale    [provider]  hydrochlorothiazide (HYDRODIURIL) 12.5 MG tablet Take by mouth. 03/20/18 10/30/18  [provider]  lisinopril (PRINIVIL,ZESTRIL) 5 MG tablet TAKE 1 TABLET BY MOUTH ONCE DAILY 04/17/15 10/30/18  [provider]    Family History Family History  Problem Relation Age of Onset   Thyroid disease  Mother    Cancer Mother    Diabetes Mother     Social History Social History   Tobacco Use   Smoking status: Former    Packs/day: 1.50    Years: 2.00    Additional pack years: 0.00    Total pack years: 3.00    Types: Cigarettes    Quit date: 05/07/2007    Years since quitting: 15.4   Smokeless tobacco: Never  Vaping Use   Vaping Use: Never used  Substance Use Topics   Alcohol use: No   Drug use: No     Allergies   Sildenafil, Gabapentin, and Viagra [sildenafil citrate]   Review of Systems Review of Systems  Constitutional:  Positive for fatigue. Negative for diaphoresis and fever.  HENT:  Positive for congestion and sinus pressure. Negative for rhinorrhea and sore throat.   Respiratory:  Positive for cough and shortness of breath. Negative for wheezing.   Cardiovascular:  Positive for chest pain. Negative for palpitations and leg swelling.  Gastrointestinal:  Negative for abdominal pain, nausea and vomiting.  Musculoskeletal:  Negative for back pain and neck pain.  Neurological:  Negative for dizziness, syncope, facial asymmetry, weakness, light-headedness, numbness and headaches.     Physical Exam Triage Vital Signs ED Triage Vitals  Enc Vitals Group     BP      Pulse      Resp      Temp      Temp src      SpO2      Weight      Height      Head Circumference      Peak Flow      Pain Score      Pain Loc      Pain Edu?      Excl. in GC?    No data found.  Updated Vital Signs BP (!) 143/80 (BP Location: Right Arm)   Pulse 68   Temp 98 F (36.7 C) (Oral)   Resp 16   SpO2 95%     Physical Exam Vitals and nursing note reviewed.  Constitutional:      General: He is not in acute distress.    Appearance: Normal appearance. He is well-developed. He is obese. He is not ill-appearing.  HENT:     Head: Normocephalic and atraumatic.     Nose: Congestion present.     Mouth/Throat:     Mouth: Mucous membranes are moist.     Pharynx: Oropharynx is  clear. No posterior oropharyngeal erythema.  Eyes:     General: No scleral icterus.    Conjunctiva/sclera: Conjunctivae normal.  Cardiovascular:     Rate and Rhythm: Normal rate and regular rhythm.     Heart sounds: Normal heart sounds.  Pulmonary:     Effort: Pulmonary effort is normal. No respiratory distress.     Breath sounds: Normal  breath sounds. No wheezing, rhonchi or rales.  Musculoskeletal:     Cervical back: Neck supple.  Skin:    General: Skin is warm and dry.     Capillary Refill: Capillary refill takes less than 2 seconds.  Neurological:     General: No focal deficit present.     Mental Status: He is alert. Mental status is at baseline.     Motor: No weakness.     Gait: Gait normal.  Psychiatric:        Mood and Affect: Mood normal.        Behavior: Behavior normal.      UC Treatments / Results  Labs (all labs ordered are listed, but only abnormal results are displayed) Labs Reviewed - No data to display   EKG   Radiology DG Chest 2 View  Result Date: 10/16/2022 CLINICAL DATA:  Cough, congestion, and shortness of breath for the past week. EXAM: CHEST - 2 VIEW COMPARISON:  Chest x-ray dated May 29, 2022. FINDINGS: The heart size and mediastinal contours are within normal limits. Both lungs are clear. The visualized skeletal structures are unremarkable. IMPRESSION: No active cardiopulmonary disease. Electronically Signed   By: Obie Dredge M.D.   On: 10/16/2022 09:37    Procedures Procedures (including critical care time)  Medications Ordered in UC Medications - No data to display  Initial Impression / Assessment and Plan / UC Course  I have reviewed the triage vital signs and the nursing notes.  Pertinent labs & imaging results that were available during my care of the patient were reviewed by me and considered in my medical decision making (see chart for details).   62 year old male presents for 1 week history of cough, congestion, sinus  pressure, fatigue, shortness of breath.  Patient is afebrile and overall well-appearing.  No acute distress.  Chest clear to auscultation and heart regular rate and rhythm.  Chest x-ray ordered.  X-ray negative.  Reviewed chest x-ray results with patient.   Symptoms consistent with reactive airway disease.  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  I sent an albuterol inhaler to use for shortness of breath and benzonatate for cough.  Advised him to use diabetic approved cough medication as well.  He is reporting some variations in his blood sugars.  He does wear a sensor.  Discussed increasing his rest and fluids and continue to monitor.  He says whenever his sugars get low, around 60 takes a glucose tablet and when they become high up to 300 he might adjust his insulin accordingly when he is eating but also reports that he tries to get up and exercise to reduce his sugar.  Discussed going to the emergency department if he has any increased fatigue, weakness, worsening chest pain/pressure or increased shortness of breath or difficult to monitor blood sugars.   Final Clinical Impressions(s) / UC Diagnoses   Final diagnoses:  Reactive airway disease without complication, unspecified asthma severity, unspecified whether persistent  Shortness of breath  Acute cough     Discharge Instructions      -Your x-ray is normal. - You are likely reacting to chemicals at work.  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 - I do not hear any wheezing today but since you said you have had some wheezing and report coughing fits, you may have reactive airway disease.  I sent an inhaler for you to use. - I also sent cough pills for  you to take.  Increase rest and fluids.  Consider using a diabetic approved cough syrup as well. - If you feel that you are having worsening shortness of breath, increased chest pain that does not go away, weakness, increased fatigue  or dizziness, please go immediately to the emergency department.     ED Prescriptions     Medication Sig Dispense Auth. Provider   benzonatate (TESSALON) 200 MG capsule Take 1 capsule (200 mg total) by mouth 3 (three) times daily as needed for cough. 30 capsule Eusebio Friendly B, PA-C   albuterol (VENTOLIN HFA) 108 (90 Base) MCG/ACT inhaler Inhale 1-2 puffs into the lungs every 6 (six) hours as needed for wheezing or shortness of breath. 1 g Shirlee Latch, PA-C      PDMP not reviewed this encounter.   Shirlee Latch, PA-C 10/16/22 [phone]

## 2022-10-16 NOTE — Discharge Instructions (Signed)
-  Your x-ray is normal. - You are likely reacting to chemicals at work.  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 - I do not hear any wheezing today but since you said you have had some wheezing and report coughing fits, you may have reactive airway disease.  I sent an inhaler for you to use. - I also sent cough pills for you to take.  Increase rest and fluids.  Consider using a diabetic approved cough syrup as well. - If you feel that you are having worsening shortness of breath, increased chest pain that does not go away, weakness, increased fatigue or dizziness, please go immediately to the emergency department.

## 2022-10-16 NOTE — ED Triage Notes (Addendum)
Patient presents to UC for fatigue, SOB, facial pressure, productive cough, and chest congestion x 1 week. States when coughing he gets light headed. Taking Claritin. 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 Reports the mist from the chemicals triggers his symptoms.   Denies fever.

## 2022-12-03 ENCOUNTER — Ambulatory Visit
Admission: EM | Admit: 2022-12-03 | Discharge: 2022-12-03 | Disposition: A | Discharge status: Home / Self Care | Payer: BC Managed Care – PPO | Attending: Physician Assistant | Admitting: Physician Assistant

## 2022-12-03 DIAGNOSIS — B349 Viral infection, unspecified: Secondary | ICD-10-CM | POA: Diagnosis present

## 2022-12-03 DIAGNOSIS — E1165 Type 2 diabetes mellitus with hyperglycemia: Secondary | ICD-10-CM | POA: Diagnosis not present

## 2022-12-03 DIAGNOSIS — R051 Acute cough: Secondary | ICD-10-CM | POA: Diagnosis present

## 2022-12-03 DIAGNOSIS — R0602 Shortness of breath: Secondary | ICD-10-CM | POA: Insufficient documentation

## 2022-12-03 DIAGNOSIS — Z794 Long term (current) use of insulin: Secondary | ICD-10-CM | POA: Insufficient documentation

## 2022-12-03 DIAGNOSIS — R21 Rash and other nonspecific skin eruption: Secondary | ICD-10-CM | POA: Diagnosis present

## 2022-12-03 DIAGNOSIS — Z1152 Encounter for screening for COVID-19: Secondary | ICD-10-CM | POA: Insufficient documentation

## 2022-12-03 LAB — CBC WITH DIFFERENTIAL/PLATELET
Abs Immature Granulocytes: 0.02 10*3/uL (ref 0.00–0.07)
Basophils Absolute: 0.1 10*3/uL (ref 0.0–0.1)
Basophils Relative: 2 %
Eosinophils Absolute: 0.4 10*3/uL (ref 0.0–0.5)
Eosinophils Relative: 8 %
HCT: 46.3 % (ref 39.0–52.0)
Hemoglobin: 15.4 g/dL (ref 13.0–17.0)
Immature Granulocytes: 0 %
Lymphocytes Relative: 24 %
Lymphs Abs: 1.2 10*3/uL (ref 0.7–4.0)
MCH: 29.8 pg (ref 26.0–34.0)
MCHC: 33.3 g/dL (ref 30.0–36.0)
MCV: 89.7 fL (ref 80.0–100.0)
Monocytes Absolute: 0.5 10*3/uL (ref 0.1–1.0)
Monocytes Relative: 9 %
Neutro Abs: 3.1 10*3/uL (ref 1.7–7.7)
Neutrophils Relative %: 57 %
Platelets: 208 10*3/uL (ref 150–400)
RBC: 5.16 MIL/uL (ref 4.22–5.81)
RDW: 14 % (ref 11.5–15.5)
WBC: 5.3 10*3/uL (ref 4.0–10.5)
nRBC: 0 % (ref 0.0–0.2)

## 2022-12-03 LAB — COMPREHENSIVE METABOLIC PANEL
ALT: 31 U/L (ref 0–44)
AST: 27 U/L (ref 15–41)
Albumin: 4.2 g/dL (ref 3.5–5.0)
Alkaline Phosphatase: 61 U/L (ref 38–126)
Anion gap: 9 (ref 5–15)
BUN: 50 mg/dL — ABNORMAL HIGH (ref 8–23)
CO2: 24 mmol/L (ref 22–32)
Calcium: 8.9 mg/dL (ref 8.9–10.3)
Chloride: 98 mmol/L (ref 98–111)
Creatinine, Ser: 1.53 mg/dL — ABNORMAL HIGH (ref 0.61–1.24)
GFR, Estimated: 51 mL/min — ABNORMAL LOW (ref >60)
Glucose, Bld: 239 mg/dL — ABNORMAL HIGH (ref 70–99)
Potassium: 4 mmol/L (ref 3.5–5.1)
Sodium: 131 mmol/L — ABNORMAL LOW (ref 135–145)
Total Bilirubin: 0.5 mg/dL (ref 0.3–1.2)
Total Protein: 7.5 g/dL (ref 6.5–8.1)

## 2022-12-03 LAB — SARS CORONAVIRUS 2 BY RT PCR: SARS Coronavirus 2 by RT PCR: NEGATIVE

## 2022-12-03 MED ORDER — LORATADINE 10 MG PO TABS: 10.0000 mg | ORAL_TABLET | Freq: Every day | ORAL | 0 refills | Status: AC

## 2022-12-03 MED ORDER — BENZONATATE 200 MG PO CAPS: 200.0000 mg | ORAL_CAPSULE | Freq: Three times a day (TID) | ORAL | 0 refills | Status: DC | PRN

## 2022-12-03 MED ORDER — TRIAMCINOLONE ACETONIDE 0.1 % EX CREA: 1.0000 | TOPICAL_CREAM | Freq: Two times a day (BID) | CUTANEOUS | 0 refills | Status: DC

## 2022-12-03 NOTE — ED Triage Notes (Signed)
Patient presents to UC c/o rash/bumps on arms and hands onset x couple days ago, itchy.   Pt also c/o SOB, pt states he does have a cough as well, head pressure.

## 2022-12-03 NOTE — Discharge Instructions (Addendum)
-  COVID test is negative.  I think you have a different virus.  I sent cough medication to the pharmacy for you. - Your sodium level is a bit low. - Your blood sugar is elevated.  We discussed cutting back on carbohydrates including avoiding starchy foods such as biscuits, [place] fries and other fast food items.  Continue insulin. -Send antihistamine to help with the rash.  You may apply the topical corticosteroid to the hands and arms.  Avoid use on face.  If you need to use anything on your face you may use small amount of hydrocortisone cream mixed with a moisturizer. - If you feel that your dizziness, fatigue, cough or shortness of breath worsen you should go to the ER.

## 2022-12-03 NOTE — ED Provider Notes (Signed)
[facility]    CSN: [id number] Arrival date & time: 12/03/22  2725      History   Chief Complaint Chief Complaint  Patient presents with   Shortness of Breath   Rash    HPI FROILAN GREENLAW is a 62 y.o. male  with a history of hypertension, hyperlipidemia, CKD, insulin dependent type 2 diabetes, and secondary hyperparathyroidism who presents with 3-4 day history of mostly dry but sometimes productive cough (white sputum), fatigue, rash on face and right arm, congestion/runny nose, and uncontrollable diarrhea. Denies fever, sore throat, ear pain, vomiting  or abdominal pain. Denies any sick contacts.  Patient states he has been coughing so much it makes him feel short of breath.   He has tried Claritin and Robitussin with some relief.  He denies any history of asthma or reactive airway disease. Patient seen by me last month for similar symptoms. Had a normal chest x-ray. He was given inhaler and reported that it helped. Denies smoking or alcohol use.  Denies any sick contacts or known exposure to COVID.  Patient does report some concern for Listeria infection.  He denies any consumption of deli meats.  He does report eating a lot of eggs recently.  HPI  Past Medical History:  Diagnosis Date   Anxiety    Chronic kidney disease    Depression    Diabetes mellitus without complication (HCC)    Hypercholesteremia    Hyperlipidemia    Hypertension    Secondary hyperparathyroidism of renal origin (HCC)     There are no problems to display for this patient.   Past Surgical History:  Procedure Laterality Date   CHOLECYSTECTOMY         Home Medications    Prior to Admission medications   Medication Sig Start Date End Date Taking? Authorizing Provider  benzonatate (TESSALON) 200 MG capsule Take 1 capsule (200 mg total) by mouth 3 (three) times daily as needed for cough. 12/03/22  Yes Eusebio Friendly B, PA-C  loratadine (CLARITIN) 10 MG tablet Take 1 tablet (10 mg total)  by mouth daily. 12/03/22  Yes Eusebio Friendly B, PA-C  triamcinolone cream (KENALOG) 0.1 % Apply 1 Application topically 2 (two) times daily. 12/03/22  Yes Shirlee Latch, PA-C  albuterol (VENTOLIN HFA) 108 (90 Base) MCG/ACT inhaler Inhale 1-2 puffs into the lungs every 6 (six) hours as needed for wheezing or shortness of breath. 10/16/22   Eusebio Friendly B, PA-C  amLODipine (NORVASC) 5 MG tablet Take 1 tablet by mouth daily. 04/17/15   [provider]  atorvastatin (LIPITOR) 10 MG tablet Take 10 mg by mouth daily.    [provider]  Continuous Blood Gluc Sensor (FREESTYLE LIBRE 2 SENSOR) MISC  01/30/22   [provider]  DULoxetine (CYMBALTA) 20 MG capsule Take 20 mg by mouth daily.    [provider]  fluticasone (FLONASE) 50 MCG/ACT nasal spray Place 1 spray into both nostrils daily. 02/05/22   Lamptey, Britta Mccreedy, MD  insulin glargine, 2 Unit Dial, (TOUJEO MAX SOLOSTAR) 300 UNIT/ML Solostar Pen Inject 78 Units into the skin daily. 05/08/22   [provider]  Insulin Pen Needle (BD PEN NEEDLE NANO U/F) 32G X 4 MM MISC Use 1 each once daily E11.65 02/17/17   [provider]  ipratropium (ATROVENT) 0.06 % nasal spray Place 2 sprays into both nostrils 2 (two) times daily. 04/15/22   [provider]  lisinopril-hydrochlorothiazide (ZESTORETIC) 20-25 MG tablet Take 1 tablet by  mouth daily. 10/30/18   Tommie Sams, DO  metFORMIN (GLUCOPHAGE) 1000 MG tablet TAKE 1 TABLET BY MOUTH TWICE DAILY WITH MEALS. 12/14/14   [provider]  metoprolol succinate (TOPROL-XL) 25 MG 24 hr tablet 25 mg daily. 10/19/18   [provider]  NOVOLOG FLEXPEN 100 UNIT/ML FlexPen Inject 18-32 Units into the skin 3 (three) times daily with meals. Based on sliding scale    [provider]  hydrochlorothiazide (HYDRODIURIL) 12.5 MG tablet Take by mouth. 03/20/18 10/30/18  [provider]  lisinopril (PRINIVIL,ZESTRIL) 5 MG tablet TAKE 1 TABLET  BY MOUTH ONCE DAILY 04/17/15 10/30/18  [provider]    Family History Family History  Problem Relation Age of Onset   Thyroid disease Mother    Cancer Mother    Diabetes Mother     Social History Social History   Tobacco Use   Smoking status: Former    Current packs/day: 0.00    Average packs/day: 1.5 packs/day for 2.0 years (3.0 ttl pk-yrs)    Types: Cigarettes    Start date: 05/06/2005    Quit date: 05/07/2007    Years since quitting: 15.5   Smokeless tobacco: Never  Vaping Use   Vaping status: Never Used  Substance Use Topics   Alcohol use: No   Drug use: No     Allergies   Sildenafil, Gabapentin, and Viagra [sildenafil citrate]   Review of Systems Review of Systems  Constitutional:  Positive for fatigue. Negative for diaphoresis and fever.  HENT:  Positive for congestion and rhinorrhea. Negative for sinus pressure and sore throat.   Respiratory:  Positive for cough and shortness of breath. Negative for wheezing.   Cardiovascular:  Negative for chest pain, palpitations and leg swelling.  Gastrointestinal:  Positive for diarrhea. Negative for abdominal pain, nausea and vomiting.  Musculoskeletal:  Negative for back pain and neck pain.  Skin:  Positive for rash.  Neurological:  Positive for dizziness. Negative for syncope, facial asymmetry, weakness, light-headedness, numbness and headaches.     Physical Exam Triage Vital Signs ED Triage Vitals  Enc Vitals Group     BP      Pulse      Resp      Temp      Temp src      SpO2      Weight      Height      Head Circumference      Peak Flow      Pain Score      Pain Loc      Pain Edu?      Excl. in GC?    No data found.  Updated Vital Signs BP (!) 144/80 (BP Location: Left Arm)   Pulse 89   Temp 98 F (36.7 C) (Oral)   SpO2 94%     Physical Exam Vitals and nursing note reviewed.  Constitutional:      General: He is not in acute distress.    Appearance: Normal appearance. He is  well-developed. He is obese. He is not ill-appearing.  HENT:     Head: Normocephalic and atraumatic.     Nose: Congestion present.     Mouth/Throat:     Mouth: Mucous membranes are moist.     Pharynx: Oropharynx is clear. No posterior oropharyngeal erythema.  Eyes:     General: No scleral icterus.    Conjunctiva/sclera: Conjunctivae normal.  Cardiovascular:     Rate and Rhythm: Normal rate and regular rhythm.  Heart sounds: Normal heart sounds.  Pulmonary:     Effort: Pulmonary effort is normal. No respiratory distress.     Breath sounds: Normal breath sounds. No wheezing, rhonchi or rales.  Abdominal:     Palpations: Abdomen is soft.     Tenderness: There is no abdominal tenderness.  Musculoskeletal:     Cervical back: Neck supple.  Skin:    General: Skin is warm and dry.     Capillary Refill: Capillary refill takes less than 2 seconds.     Findings: Rash (There are a few scattered erythematous papules of the left arm. No TTP. Fine macular/possibly petechial rash of upper back and neck.) present.  Neurological:     General: No focal deficit present.     Mental Status: He is alert. Mental status is at baseline.     Motor: No weakness.     Gait: Gait normal.  Psychiatric:        Mood and Affect: Mood normal.        Behavior: Behavior normal.      UC Treatments / Results  Labs (all labs ordered are listed, but only abnormal results are displayed) Labs Reviewed  COMPREHENSIVE METABOLIC PANEL - Abnormal; Notable for the following components:      Result Value   Sodium 131 (*)    Glucose, Bld 239 (*)    BUN 50 (*)    Creatinine, Ser 1.53 (*)    GFR, Estimated 51 (*)    All other components within normal limits  SARS CORONAVIRUS 2 BY RT PCR  CBC WITH DIFFERENTIAL/PLATELET     EKG   Radiology No results found.  Procedures Procedures (including critical care time)  Medications Ordered in UC Medications - No data to display  Initial Impression / Assessment  and Plan / UC Course  I have reviewed the triage vital signs and the nursing notes.  Pertinent labs & imaging results that were available during my care of the patient were reviewed by me and considered in my medical decision making (see chart for details).   62 year old male presents for 3-4 day history of cough, congestion, dizziness, fatigue, shortness of breath, diarrhea and rash.  Patient is afebrile and overall well-appearing.  No acute distress.  Chest clear to auscultation and heart regular rate and rhythm.  On exam he has 3 erythematous papules of the right hand and forearm.  Papules are nontender.  Most consistent with insect bite.  Additionally he has fine macular/possibly petechial rash of neck and upper back.  CBC obtained given possibly petechial rash.  Also obtaining CMP and COVID test.  Symptoms consistent with viral illness and allergies.  Will treat with benzonatate for cough, loratadine for rash and itching and topical triamcinolone for the suspected insect bites on the right hand and arm.  He is reporting some dizziness.  Metabolic panel shows glucose of 239 and elevated BUN of 50, elevated creatinine 1.53 and decrease odium 131.  He is reporting some variations in his blood sugars.  He does wear a sensor.  Discussed increasing his rest, dietary sodium (for a short time) and fluids (mildly) and continue to monitor.  He says he does eat a lot of fast food and had [employer] for breakfast this morning with a biscuit and [place] fries.  We discussed avoiding fast food especially carbohydrates and high sugar containing foods.  Patient says he will try to do better.  Discussed going to the emergency department if he has any increased fatigue,  weakness, worsening cough, worsening rash, chest pain/pressure or increased shortness of breath or difficult to monitor blood sugars.   Final Clinical Impressions(s) / UC Diagnoses   Final diagnoses:  Viral illness  Rash  Acute cough  Shortness  of breath  Type 2 diabetes mellitus with hyperglycemia, with long-term current use of insulin (HCC)     Discharge Instructions      -COVID test is negative.  I think you have a different virus.  I sent cough medication to the pharmacy for you. - Your sodium level is a bit low. - Your blood sugar is elevated.  We discussed cutting back on carbohydrates including avoiding starchy foods such as biscuits, [place] fries and other fast food items.  Continue insulin. -Send antihistamine to help with the rash.  You may apply the topical corticosteroid to the hands and arms.  Avoid use on face.  If you need to use anything on your face you may use small amount of hydrocortisone cream mixed with a moisturizer. - If you feel that your dizziness, fatigue, cough or shortness of breath worsen you should go to the ER.      ED Prescriptions     Medication Sig Dispense Auth. Provider   benzonatate (TESSALON) 200 MG capsule Take 1 capsule (200 mg total) by mouth 3 (three) times daily as needed for cough. 30 capsule Eusebio Friendly B, PA-C   triamcinolone cream (KENALOG) 0.1 % Apply 1 Application topically 2 (two) times daily. 30 g Eusebio Friendly B, PA-C   loratadine (CLARITIN) 10 MG tablet Take 1 tablet (10 mg total) by mouth daily. 30 tablet Gareth Morgan      PDMP not reviewed this encounter.      Shirlee Latch, PA-C 12/03/22 1030

## 2023-02-04 ENCOUNTER — Encounter: Payer: Self-pay | Admitting: Emergency Medicine

## 2023-02-04 ENCOUNTER — Ambulatory Visit
Admission: EM | Admit: 2023-02-04 | Discharge: 2023-02-04 | Disposition: A | Discharge status: Home / Self Care | Payer: BC Managed Care – PPO | Attending: Emergency Medicine | Admitting: Emergency Medicine

## 2023-02-04 DIAGNOSIS — E119 Type 2 diabetes mellitus without complications: Secondary | ICD-10-CM

## 2023-02-04 DIAGNOSIS — R739 Hyperglycemia, unspecified: Secondary | ICD-10-CM | POA: Diagnosis not present

## 2023-02-04 LAB — GLUCOSE, CAPILLARY: Glucose-Capillary: 190 mg/dL — ABNORMAL HIGH (ref 70–99)

## 2023-02-04 NOTE — Discharge Instructions (Signed)
Please follow up with your PCP, keep appt next week as scheduled, sooner if worse. Return as needed.

## 2023-02-04 NOTE — ED Provider Notes (Signed)
[facility]    CSN: [id number] Arrival date & time: 02/04/23  0956      History   Chief Complaint Chief Complaint  Patient presents with   Fatigue   Diarrhea    HPI Erik Larsen is a 62 y.o. male.   62 year old male pt, Erik Larsen, presents to fatigue, diarrhea x 1 week. Recently changed insulin dosage. Pt requesting work note.  BS is 190 in office. Pt is eating and drinking well  The history is provided by the patient. No language interpreter was used.    Past Medical History:  Diagnosis Date   Anxiety    Chronic kidney disease    Depression    Diabetes mellitus without complication (HCC)    Hypercholesteremia    Hyperlipidemia    Hypertension    Secondary hyperparathyroidism of renal origin [facility])     Patient Active Problem List   Diagnosis Date Noted   Hyperglycemia 02/04/2023    Past Surgical History:  Procedure Laterality Date   CHOLECYSTECTOMY         Home Medications    Prior to Admission medications   Medication Sig Start Date End Date Taking? Authorizing Provider  albuterol (VENTOLIN HFA) 108 (90 Base) MCG/ACT inhaler Inhale 1-2 puffs into the lungs every 6 (six) hours as needed for wheezing or shortness of breath. 10/16/22   Eusebio Friendly B, PA-C  amLODipine (NORVASC) 5 MG tablet Take 1 tablet by mouth daily. 04/17/15   [provider]  atorvastatin (LIPITOR) 10 MG tablet Take 10 mg by mouth daily.    [provider]  benzonatate (TESSALON) 200 MG capsule Take 1 capsule (200 mg total) by mouth 3 (three) times daily as needed for cough. 12/03/22   Shirlee Latch, PA-C  Continuous Blood Gluc Sensor (FREESTYLE LIBRE 2 SENSOR) MISC  01/30/22   [provider]  DULoxetine (CYMBALTA) 20 MG capsule Take 20 mg by mouth daily.    [provider]  fluticasone (FLONASE) 50 MCG/ACT nasal spray Place 1 spray into both nostrils daily. 02/05/22   Lamptey, Britta Mccreedy, MD  insulin glargine, 2 Unit Dial, (TOUJEO  MAX SOLOSTAR) 300 UNIT/ML Solostar Pen Inject 78 Units into the skin daily. 05/08/22   [provider]  Insulin Pen Needle (BD PEN NEEDLE NANO U/F) 32G X 4 MM MISC Use 1 each once daily E11.65 02/17/17   [provider]  ipratropium (ATROVENT) 0.06 % nasal spray Place 2 sprays into both nostrils 2 (two) times daily. 04/15/22   [provider]  lisinopril-hydrochlorothiazide (ZESTORETIC) 20-25 MG tablet Take 1 tablet by mouth daily. 10/30/18   Tommie Sams, DO  loratadine (CLARITIN) 10 MG tablet Take 1 tablet (10 mg total) by mouth daily. 12/03/22   Eusebio Friendly B, PA-C  metFORMIN (GLUCOPHAGE) 1000 MG tablet TAKE 1 TABLET BY MOUTH TWICE DAILY WITH MEALS. 12/14/14   [provider]  metoprolol succinate (TOPROL-XL) 25 MG 24 hr tablet 25 mg daily. 10/19/18   [provider]  NOVOLOG FLEXPEN 100 UNIT/ML FlexPen Inject 18-32 Units into the skin 3 (three) times daily with meals. Based on sliding scale    [provider]  triamcinolone cream (KENALOG) 0.1 % Apply 1 Application topically 2 (two) times daily. 12/03/22   Shirlee Latch, PA-C  hydrochlorothiazide (HYDRODIURIL) 12.5 MG tablet Take by mouth. 03/20/18 10/30/18  [provider]  lisinopril (PRINIVIL,ZESTRIL) 5 MG tablet TAKE 1 TABLET BY MOUTH ONCE DAILY 04/17/15 10/30/18  [provider]    Family History Family History  Problem Relation Age of Onset   Thyroid disease Mother    Cancer Mother    Diabetes Mother     Social History Social History   Tobacco Use   Smoking status: Former    Current packs/day: 0.00    Average packs/day: 1.5 packs/day for 2.0 years (3.0 ttl pk-yrs)    Types: Cigarettes    Start date: 05/06/2005    Quit date: 05/07/2007    Years since quitting: 15.7   Smokeless tobacco: Never  Vaping Use   Vaping status: Never Used  Substance Use Topics   Alcohol use: No   Drug use: No     Allergies   Sildenafil, Gabapentin, and Viagra [sildenafil  citrate]   Review of Systems Review of Systems  Constitutional:  Positive for fatigue.  Gastrointestinal:  Positive for diarrhea. Negative for nausea and vomiting.  Genitourinary:  Negative for dysuria.  All other systems reviewed and are negative.    Physical Exam Triage Vital Signs ED Triage Vitals  Encounter Vitals Group     BP      Systolic BP Percentile      Diastolic BP Percentile      Pulse      Resp      Temp      Temp src      SpO2      Weight      Height      Head Circumference      Peak Flow      Pain Score      Pain Loc      Pain Education      Exclude from Growth Chart    No data found.  Updated Vital Signs BP (!) 157/85 (BP Location: Left Arm)   Pulse 86   Temp 98.5 F (36.9 C) (Oral)   Resp 18   SpO2 97%   Visual Acuity Right Eye Distance:   Left Eye Distance:   Bilateral Distance:    Right Eye Near:   Left Eye Near:    Bilateral Near:     Physical Exam Vitals and nursing note reviewed.  Constitutional:      Appearance: He is well-developed and well-groomed.  Cardiovascular:     Rate and Rhythm: Normal rate and regular rhythm.     Pulses: Normal pulses.     Heart sounds: Normal heart sounds.  Pulmonary:     Effort: Pulmonary effort is normal.     Breath sounds: Normal breath sounds and air entry.  Neurological:     General: No focal deficit present.     Mental Status: He is alert and oriented to person, place, and time.     GCS: GCS eye subscore is 4. GCS verbal subscore is 5. GCS motor subscore is 6.  Psychiatric:        Attention and Perception: Attention normal.        Mood and Affect: Mood normal.        Speech: Speech normal.        Behavior: Behavior normal. Behavior is cooperative.      UC Treatments / Results  Labs (all labs ordered are listed, but only abnormal results are displayed) Labs Reviewed  GLUCOSE, CAPILLARY - Abnormal; Notable for the following components:      Result Value   Glucose-Capillary 190 (*)     All other components within normal limits  CBG MONITORING, ED    EKG  Radiology No results found.  Procedures Procedures (including critical care time)  Medications Ordered in UC Medications - No data to display  Initial Impression / Assessment and Plan / UC Course  I have reviewed the triage vital signs and the nursing notes.  Pertinent labs & imaging results that were available during my care of the patient were reviewed by me and considered in my medical decision making (see chart for details).     Ddx: Hyperglycemia, diarrhea, viral illness Final Clinical Impressions(s) / UC Diagnoses   Final diagnoses:  Hyperglycemia     Discharge Instructions      Please follow up with your PCP, keep appt next week as scheduled, sooner if worse. Return as needed.     ED Prescriptions   None    PDMP not reviewed this encounter.   Clancy Gourd, NP 02/04/23 1758

## 2023-02-04 NOTE — ED Triage Notes (Signed)
Pt presents with fatigue and diarrhea x 1 week.Pt states there has been a change in his insulin dosage and think he may be taking too much insulin.

## 2023-02-14 ENCOUNTER — Other Ambulatory Visit: Payer: Self-pay | Admitting: Internal Medicine

## 2023-02-14 DIAGNOSIS — E782 Mixed hyperlipidemia: Secondary | ICD-10-CM

## 2023-03-06 ENCOUNTER — Other Ambulatory Visit: Payer: BC Managed Care – PPO

## 2023-06-09 ENCOUNTER — Ambulatory Visit
Admission: EM | Admit: 2023-06-09 | Discharge: 2023-06-09 | Disposition: A | Discharge status: Home / Self Care | Payer: BC Managed Care – PPO | Attending: Emergency Medicine | Admitting: Emergency Medicine

## 2023-06-09 DIAGNOSIS — J069 Acute upper respiratory infection, unspecified: Secondary | ICD-10-CM

## 2023-06-09 LAB — RESP PANEL BY RT-PCR (FLU A&B, COVID) ARPGX2
Influenza A by PCR: NEGATIVE
Influenza B by PCR: NEGATIVE
SARS Coronavirus 2 by RT PCR: NEGATIVE

## 2023-06-09 LAB — GROUP A STREP BY PCR: Group A Strep by PCR: NOT DETECTED

## 2023-06-09 MED ORDER — BENZONATATE 200 MG PO CAPS: 200.0000 mg | ORAL_CAPSULE | Freq: Three times a day (TID) | ORAL | 0 refills | Status: AC | PRN

## 2023-06-09 MED ORDER — PROMETHAZINE-DM 6.25-15 MG/5ML PO SYRP: 5.0000 mL | ORAL_SOLUTION | Freq: Four times a day (QID) | ORAL | 0 refills | Status: AC | PRN

## 2023-06-09 MED ORDER — IPRATROPIUM BROMIDE 0.06 % NA SOLN: 2.0000 | Freq: Two times a day (BID) | NASAL | 0 refills | Status: AC

## 2023-06-09 NOTE — ED Triage Notes (Signed)
Pt c/o cough,congestion,sore throat & head pressure x1 day. Has tried safe tussin w/o relief.

## 2023-06-09 NOTE — Discharge Instructions (Signed)
Your testing today was negative for COVID, influenza, and strep.  You do have a viral respiratory illness which is causing your symptoms.  Please use over-the-counter Tylenol and/or ibuprofen according the package instructions as needed for any fever or pain.  Use the Atrovent nasal spray, 2 squirts in each nostril every 6 hours, as needed for runny nose and postnasal drip.  Use the Tessalon Perles every 8 hours during the day.  Take them with a small sip of water.  They may give you some numbness to the base of your tongue or a metallic taste in your mouth, this is normal.  Use the Promethazine DM cough syrup at bedtime for cough and congestion.  It will make you drowsy so do not take it during the day.  Return for reevaluation or see your primary care provider for any new or worsening symptoms.

## 2023-06-09 NOTE — ED Provider Notes (Signed)
[facility]    CSN: [id number] Arrival date & time: 06/09/23  0916      History   Chief Complaint Chief Complaint  Patient presents with   Cough   Sore Throat   Nasal Congestion    HPI ROSBEL BUCKNER is a 63 y.o. male.   HPI  62 year old male with past medical history significant for hypertension, chronic kidney disease, hypercholesterolemia, secondary hyperparathyroidism, diabetes, and depression presents for evaluation of respiratory symptoms which began yesterday.  These include head pressure, nasal congestion clear nasal discharge, sore throat, nonproductive cough, and diarrhea.  Though, he is not sure if the diarrhea is secondary to his illness or related to the [facility] that he is taking as he has eaten some carbohydrates and this tends to give him diarrhea.  He denies any fever, shortness breath, wheezing, nausea, or vomiting.  No known sick contacts.  Past Medical History:  Diagnosis Date   Anxiety    Chronic kidney disease    Depression    Diabetes mellitus without complication (HCC)    Hypercholesteremia    Hyperlipidemia    Hypertension    Secondary hyperparathyroidism of renal origin [facility])     Patient Active Problem List   Diagnosis Date Noted   Hyperglycemia 02/04/2023    Past Surgical History:  Procedure Laterality Date   CHOLECYSTECTOMY         Home Medications    Prior to Admission medications   Medication Sig Start Date End Date Taking? Authorizing Provider  amLODipine (NORVASC) 5 MG tablet Take 1 tablet by mouth daily. 04/17/15  Yes [provider]  atorvastatin (LIPITOR) 10 MG tablet Take 10 mg by mouth daily.   Yes [provider]  Continuous Blood Gluc Sensor (FREESTYLE LIBRE 2 SENSOR) MISC  01/30/22  Yes [provider]  DULoxetine (CYMBALTA) 20 MG capsule Take 20 mg by mouth daily.   Yes [provider]  insulin glargine, 2 Unit Dial, (TOUJEO MAX SOLOSTAR) 300 UNIT/ML Solostar Pen Inject  78 Units into the skin daily. 05/08/22  Yes [provider]  Insulin Pen Needle (BD PEN NEEDLE NANO U/F) 32G X 4 MM MISC Use 1 each once daily E11.65 02/17/17  Yes [provider]  lisinopril-hydrochlorothiazide (ZESTORETIC) 20-25 MG tablet Take 1 tablet by mouth daily. 10/30/18  Yes Cook, Jayce G, DO  loratadine (CLARITIN) 10 MG tablet Take 1 tablet (10 mg total) by mouth daily. 12/03/22  Yes Eusebio Friendly B, PA-C  metFORMIN (GLUCOPHAGE) 1000 MG tablet TAKE 1 TABLET BY MOUTH TWICE DAILY WITH MEALS. 12/14/14  Yes [provider]  metoprolol succinate (TOPROL-XL) 25 MG 24 hr tablet 25 mg daily. 10/19/18  Yes [provider]  NOVOLOG FLEXPEN 100 UNIT/ML FlexPen Inject 18-32 Units into the skin 3 (three) times daily with meals. Based on sliding scale   Yes [provider]  promethazine-dextromethorphan (PROMETHAZINE-DM) 6.25-15 MG/5ML syrup Take 5 mLs by mouth 4 (four) times daily as needed. 06/09/23  Yes Becky Augusta, NP  benzonatate (TESSALON) 200 MG capsule Take 1 capsule (200 mg total) by mouth 3 (three) times daily as needed for cough. 06/09/23   Becky Augusta, NP  ipratropium (ATROVENT) 0.06 % nasal spray Place 2 sprays into both nostrils 2 (two) times daily. 06/09/23   Becky Augusta, NP  hydrochlorothiazide (HYDRODIURIL) 12.5 MG tablet Take by mouth. 03/20/18 10/30/18  [provider]  lisinopril (PRINIVIL,ZESTRIL) 5 MG tablet TAKE 1 TABLET BY MOUTH ONCE DAILY 04/17/15 10/30/18  [provider]  Family History Family History  Problem Relation Age of Onset   Thyroid disease Mother    Cancer Mother    Diabetes Mother     Social History Social History   Tobacco Use   Smoking status: Former    Current packs/day: 0.00    Average packs/day: 1.5 packs/day for 2.0 years (3.0 ttl pk-yrs)    Types: Cigarettes    Start date: 05/06/2005    Quit date: 05/07/2007    Years since quitting: 16.1   Smokeless tobacco: Never  Vaping Use   Vaping status:  Never Used  Substance Use Topics   Alcohol use: No   Drug use: No     Allergies   Sildenafil, Gabapentin, and Viagra [sildenafil citrate]   Review of Systems Review of Systems  Constitutional:  Negative for fever.  HENT:  Positive for congestion, rhinorrhea, sneezing and sore throat. Negative for ear pain.   Respiratory:  Positive for cough. Negative for shortness of breath and wheezing.   Gastrointestinal:  Positive for diarrhea. Negative for nausea and vomiting.  Neurological:  Positive for headaches.     Physical Exam Triage Vital Signs ED Triage Vitals  Encounter Vitals Group     BP      Systolic BP Percentile      Diastolic BP Percentile      Pulse      Resp      Temp      Temp src      SpO2      Weight      Height      Head Circumference      Peak Flow      Pain Score      Pain Loc      Pain Education      Exclude from Growth Chart    No data found.  Updated Vital Signs BP 122/77 (BP Location: Left Arm)   Pulse 84   Temp 98 F (36.7 C) (Oral)   Resp 16   Ht 5\' 8"  (1.727 m)   Wt 250 lb (113.4 kg)   SpO2 99%   BMI 38.01 kg/m   Visual Acuity Right Eye Distance:   Left Eye Distance:   Bilateral Distance:    Right Eye Near:   Left Eye Near:    Bilateral Near:     Physical Exam Vitals and nursing note reviewed.  Constitutional:      Appearance: Normal appearance. He is not ill-appearing.  HENT:     Head: Normocephalic and atraumatic.     Right Ear: Tympanic membrane, ear canal and external ear normal. There is no impacted cerumen.     Left Ear: Tympanic membrane, ear canal and external ear normal. There is no impacted cerumen.     Nose: Congestion and rhinorrhea present.     Comments: Nasal mucosa is erythematous and edematous with yellow discharge in both nares.    Mouth/Throat:     Mouth: Mucous membranes are moist.     Pharynx: Oropharynx is clear. Posterior oropharyngeal erythema present. No oropharyngeal exudate.     Comments:  Tonsillar pillars are 1+ edematous and erythematous but free of exudate. Cardiovascular:     Rate and Rhythm: Normal rate and regular rhythm.     Pulses: Normal pulses.     Heart sounds: Normal heart sounds. No murmur heard.    No friction rub. No gallop.  Pulmonary:     Effort: Pulmonary effort is normal.     Breath sounds:  Normal breath sounds. No wheezing, rhonchi or rales.  Musculoskeletal:     Cervical back: Normal range of motion and neck supple. No tenderness.  Lymphadenopathy:     Cervical: No cervical adenopathy.  Skin:    General: Skin is warm and dry.     Capillary Refill: Capillary refill takes less than 2 seconds.     Findings: No rash.  Neurological:     General: No focal deficit present.     Mental Status: He is alert and oriented to person, place, and time.      UC Treatments / Results  Labs (all labs ordered are listed, but only abnormal results are displayed) Labs Reviewed  GROUP A STREP BY PCR  RESP PANEL BY RT-PCR (FLU A&B, COVID) [id number]    EKG   Radiology No results found.  Procedures Procedures (including critical care time)  Medications Ordered in UC Medications - No data to display  Initial Impression / Assessment and Plan / UC Course  I have reviewed the triage vital signs and the nursing notes.  Pertinent labs & imaging results that were available during my care of the patient were reviewed by me and considered in my medical decision making (see chart for details).   Patient is a pleasant, nontoxic-appearing, 63 year old male presenting for evaluation of 1 day worth of COVID/flulike symptoms as outlined HPI above.  His physical exam does reveal inflammation of his upper respiratory tract with yellow nasal discharge.  Also tonsillar hypertrophy and erythema but no appreciable exudate.  No cervical lymphadenopathy present.  Cardiopulmonary exam is clear lung sounds all fields.  Differential diagnosis include COVID, influenza, strep pharyngitis,  viral illness.  I will order PCR COVID, strep, and influenza.  Strep PCR is negative.  Respiratory panel is negative for COVID or influenza.  I will discharge patient with a diagnosis of viral URI with a cough with prescriptions for Atrovent nasal spray to help with his nasal congestion along with Tessalon Perles and Promethazine DM cough syrup for cough and congestion.  He may use over-the-counter Tylenol and/or ibuprofen for fever or pain.  Return precautions reviewed.  Work note provided.   Final Clinical Impressions(s) / UC Diagnoses   Final diagnoses:  Viral URI with cough     Discharge Instructions      Your testing today was negative for COVID, influenza, and strep.  You do have a viral respiratory illness which is causing your symptoms.  Please use over-the-counter Tylenol and/or ibuprofen according the package instructions as needed for any fever or pain.  Use the Atrovent nasal spray, 2 squirts in each nostril every 6 hours, as needed for runny nose and postnasal drip.  Use the Tessalon Perles every 8 hours during the day.  Take them with a small sip of water.  They may give you some numbness to the base of your tongue or a metallic taste in your mouth, this is normal.  Use the Promethazine DM cough syrup at bedtime for cough and congestion.  It will make you drowsy so do not take it during the day.  Return for reevaluation or see your primary care provider for any new or worsening symptoms.      ED Prescriptions     Medication Sig Dispense Auth. Provider   benzonatate (TESSALON) 200 MG capsule Take 1 capsule (200 mg total) by mouth 3 (three) times daily as needed for cough. 30 capsule Becky Augusta, NP   ipratropium (ATROVENT) 0.06 % nasal spray Place 2 sprays  into both nostrils 2 (two) times daily. 15 mL Becky Augusta, NP   promethazine-dextromethorphan (PROMETHAZINE-DM) 6.25-15 MG/5ML syrup Take 5 mLs by mouth 4 (four) times daily as needed. 118 mL Becky Augusta, NP       PDMP not reviewed this encounter.   Becky Augusta, NP 06/09/23 1158

## 2023-07-21 ENCOUNTER — Other Ambulatory Visit: Payer: Self-pay

## 2023-07-21 ENCOUNTER — Emergency Department
Admission: EM | Admit: 2023-07-21 | Discharge: 2023-07-21 | Disposition: A | Discharge status: Home / Self Care | Attending: Emergency Medicine | Admitting: Emergency Medicine

## 2023-07-21 ENCOUNTER — Emergency Department

## 2023-07-21 DIAGNOSIS — R079 Chest pain, unspecified: Secondary | ICD-10-CM | POA: Insufficient documentation

## 2023-07-21 DIAGNOSIS — R42 Dizziness and giddiness: Secondary | ICD-10-CM | POA: Insufficient documentation

## 2023-07-21 LAB — CBC WITH DIFFERENTIAL/PLATELET
Abs Immature Granulocytes: 0.05 10*3/uL (ref 0.00–0.07)
Basophils Absolute: 0.1 10*3/uL (ref 0.0–0.1)
Basophils Relative: 1 %
Eosinophils Absolute: 0.2 10*3/uL (ref 0.0–0.5)
Eosinophils Relative: 3 %
HCT: 40.8 % (ref 39.0–52.0)
Hemoglobin: 13.9 g/dL (ref 13.0–17.0)
Immature Granulocytes: 1 %
Lymphocytes Relative: 19 %
Lymphs Abs: 1.4 10*3/uL (ref 0.7–4.0)
MCH: 29.5 pg (ref 26.0–34.0)
MCHC: 34.1 g/dL (ref 30.0–36.0)
MCV: 86.6 fL (ref 80.0–100.0)
Monocytes Absolute: 0.6 10*3/uL (ref 0.1–1.0)
Monocytes Relative: 8 %
Neutro Abs: 5 10*3/uL (ref 1.7–7.7)
Neutrophils Relative %: 68 %
Platelets: 222 10*3/uL (ref 150–400)
RBC: 4.71 MIL/uL (ref 4.22–5.81)
RDW: 12.6 % (ref 11.5–15.5)
WBC: 7.4 10*3/uL (ref 4.0–10.5)
nRBC: 0 % (ref 0.0–0.2)

## 2023-07-21 LAB — COMPREHENSIVE METABOLIC PANEL
ALT: 19 U/L (ref 0–44)
AST: 20 U/L (ref 15–41)
Albumin: 3.7 g/dL (ref 3.5–5.0)
Alkaline Phosphatase: 53 U/L (ref 38–126)
Anion gap: 9 (ref 5–15)
BUN: 43 mg/dL — ABNORMAL HIGH (ref 8–23)
CO2: 20 mmol/L — ABNORMAL LOW (ref 22–32)
Calcium: 9 mg/dL (ref 8.9–10.3)
Chloride: 105 mmol/L (ref 98–111)
Creatinine, Ser: 1.88 mg/dL — ABNORMAL HIGH (ref 0.61–1.24)
GFR, Estimated: 40 mL/min — ABNORMAL LOW (ref >60)
Glucose, Bld: 191 mg/dL — ABNORMAL HIGH (ref 70–99)
Potassium: 3.7 mmol/L (ref 3.5–5.1)
Sodium: 134 mmol/L — ABNORMAL LOW (ref 135–145)
Total Bilirubin: 0.7 mg/dL (ref 0.0–1.2)
Total Protein: 6.4 g/dL — ABNORMAL LOW (ref 6.5–8.1)

## 2023-07-21 LAB — URINALYSIS, ROUTINE W REFLEX MICROSCOPIC
Bacteria, UA: NONE SEEN
Bilirubin Urine: NEGATIVE
Glucose, UA: NEGATIVE mg/dL
Hgb urine dipstick: NEGATIVE
Ketones, ur: NEGATIVE mg/dL
Leukocytes,Ua: NEGATIVE
Nitrite: NEGATIVE
Protein, ur: 30 mg/dL — AB
Specific Gravity, Urine: 1.01 (ref 1.005–1.030)
pH: 5 (ref 5.0–8.0)

## 2023-07-21 LAB — TROPONIN I (HIGH SENSITIVITY): Troponin I (High Sensitivity): 3 ng/L (ref <18)

## 2023-07-21 LAB — LIPASE, BLOOD: Lipase: 47 U/L (ref 11–51)

## 2023-07-21 MED ORDER — SODIUM CHLORIDE 0.9 % IV BOLUS: 500.0000 mL | Freq: Once | INTRAVENOUS | Status: DC

## 2023-07-21 MED ORDER — SODIUM CHLORIDE 0.9 % IV BOLUS
1000.0000 mL | Freq: Once | INTRAVENOUS | Status: AC
  Administered 2023-07-21: 1000 mL via INTRAVENOUS

## 2023-07-21 NOTE — Discharge Instructions (Signed)
 You were seen in the ER today for evaluation of your lightheadedness and chest discomfort.  I suspect that your lightheadedness may be related to being dehydrated.  Please make sure you are staying hydrated and eating regular meals.  Follow-up with your primary care doctor for further evaluation.  Return to the ER for any new or worsening symptoms.

## 2023-07-21 NOTE — ED Triage Notes (Signed)
 Pt to ED via [facility] from work for dizziness.    Pt also fell a couple weeks ago and continues to have right arm pain and upper back pain intermittently since then.   Pt also is being treated with antibiotics for sinus infection  Vitals for EMS HR: 80 BP: 98/58 CBG: 138  18G LFA

## 2023-07-21 NOTE — ED Provider Notes (Signed)
 [facility] Provider Note    Event Date/Time   First MD Initiated Contact with Patient 07/21/23 [phone]     (approximate)   History   Dizziness   HPI  Erik Larsen is a 63 year old male presenting to the emergency department for evaluation of dizziness.  Patient fell a few weeks ago and has had some intermittent right arm pain since that time.  More recently notes some chest pain.  He additionally reports that he had recent sinus pressure and was placed on antibiotics that caused him to have significantly decreased appetite.  Sinus pressure is improved.  Denies headache.  Did have dizziness described as lightheadedness earlier today.  No syncope.  Denies any current chest pain or shortness of breath.  Is additionally on Mounjaro for his diabetes which she reports has intermittently caused GI distress, but reports he is currently tolerating it.    Physical Exam   Triage Vital Signs: ED Triage Vitals  Encounter Vitals Group     BP 07/21/23 0238 (!) 88/55     Systolic BP Percentile --      Diastolic BP Percentile --      Pulse Rate 07/21/23 0238 76     Resp 07/21/23 0238 20     Temp --      Temp src --      SpO2 07/21/23 0238 98 %     Weight 07/21/23 0239 230 lb (104.3 kg)     Height 07/21/23 0239 5\' 7"  (1.702 m)     Head Circumference --      Peak Flow --      Pain Score 07/21/23 0238 1     Pain Loc --      Pain Education --      Exclude from Growth Chart --     Most recent vital signs: Vitals:   07/21/23 0330 07/21/23 0400  BP: 113/68 116/68  Pulse: 76 75  Resp: 16 15  SpO2: 97% 96%     General: Awake, interactive  CV:  Regular rate, good peripheral perfusion.  Resp:  Unlabored respirations, lungs good auscultation Abd:  Nondistended, soft, nontender to palpation Neuro:  Symmetric facial movement, fluid speech, 5-5 strength in the bilateral upper and lower extremities MSK:  Freely ranging the shoulder without pain.  No midline back  tenderness.   ED Results / Procedures / Treatments   Labs (all labs ordered are listed, but only abnormal results are displayed) Labs Reviewed  COMPREHENSIVE METABOLIC PANEL - Abnormal; Notable for the following components:      Result Value   Sodium 134 (*)    CO2 20 (*)    Glucose, Bld 191 (*)    BUN 43 (*)    Creatinine, Ser 1.88 (*)    Total Protein 6.4 (*)    GFR, Estimated 40 (*)    All other components within normal limits  URINALYSIS, ROUTINE W REFLEX MICROSCOPIC - Abnormal; Notable for the following components:   Color, Urine YELLOW (*)    APPearance HAZY (*)    Protein, ur 30 (*)    All other components within normal limits  CBC WITH DIFFERENTIAL/PLATELET  LIPASE, BLOOD  TROPONIN I (HIGH SENSITIVITY)     EKG EKG independently reviewed interpreted by myself (ER attending) demonstrates:  EKG demonstrates sinus rhythm rate of 75, PR 201, QRS 151, QTc 464, no acute ST changes  RADIOLOGY Imaging independently reviewed and interpreted by myself demonstrates:  CXR without focal consolidation  PROCEDURES:  Critical Care performed: No  Procedures   MEDICATIONS ORDERED IN ED: Medications  sodium chloride 0.9 % bolus 1,000 mL (0 mLs Intravenous Stopped 07/21/23 0332)     IMPRESSION / MDM / ASSESSMENT AND PLAN / ED COURSE  I reviewed the triage vital signs and the nursing notes.  Differential diagnosis includes, but is not limited to, musculoskeletal strain, ACS, pneumonia, pneumothorax, arrhythmia, volume depletion in the setting of decreased p.o., GI symptoms related to medications  Patient's presentation is most consistent with acute presentation with potential threat to life or bodily function.  63 year old male presenting with lightheadedness, also reporting ongoing shoulder and chest pain.  Slightly low blood pressure on presentation, otherwise stable vitals.  Will obtain labs, EKG, chest x-Feras Gardella to further evaluate.  IV fluids ordered.  4:12 AM Lab work  notable for mildly increased creatinine from baseline with creatinine of 1.88, suspect this may be related to dehydration given recent decreased p.o., nausea, diarrhea.  Negative troponin, reassuring EKG and x-Sung Parodi.  Blood pressure improved after fluids.  Patient reassessed.  Reports feeling improved.  Discussed results of workup.  He is comfortable with discharge home and outpatient follow-up.  Strict return precautions provided.  Patient discharged stable condition.      FINAL CLINICAL IMPRESSION(S) / ED DIAGNOSES   Final diagnoses:  Lightheadedness  Right-sided chest pain     Rx / DC Orders   ED Discharge Orders     None        Note:  This document was prepared using Dragon voice recognition software and may include unintentional dictation errors.   Trinna Post, MD 07/21/23 [phone]

## 2023-09-17 IMAGING — CR DG LUMBAR SPINE COMPLETE 4+V
5 series · 5 of 5 positions shown · non-contrast
Comparison: 06/14/2008

CLINICAL DATA: Low back pain for 1 month radiating down the left
leg.

EXAM:
LUMBAR SPINE - COMPLETE 4+ VIEW

[l-spine ap]
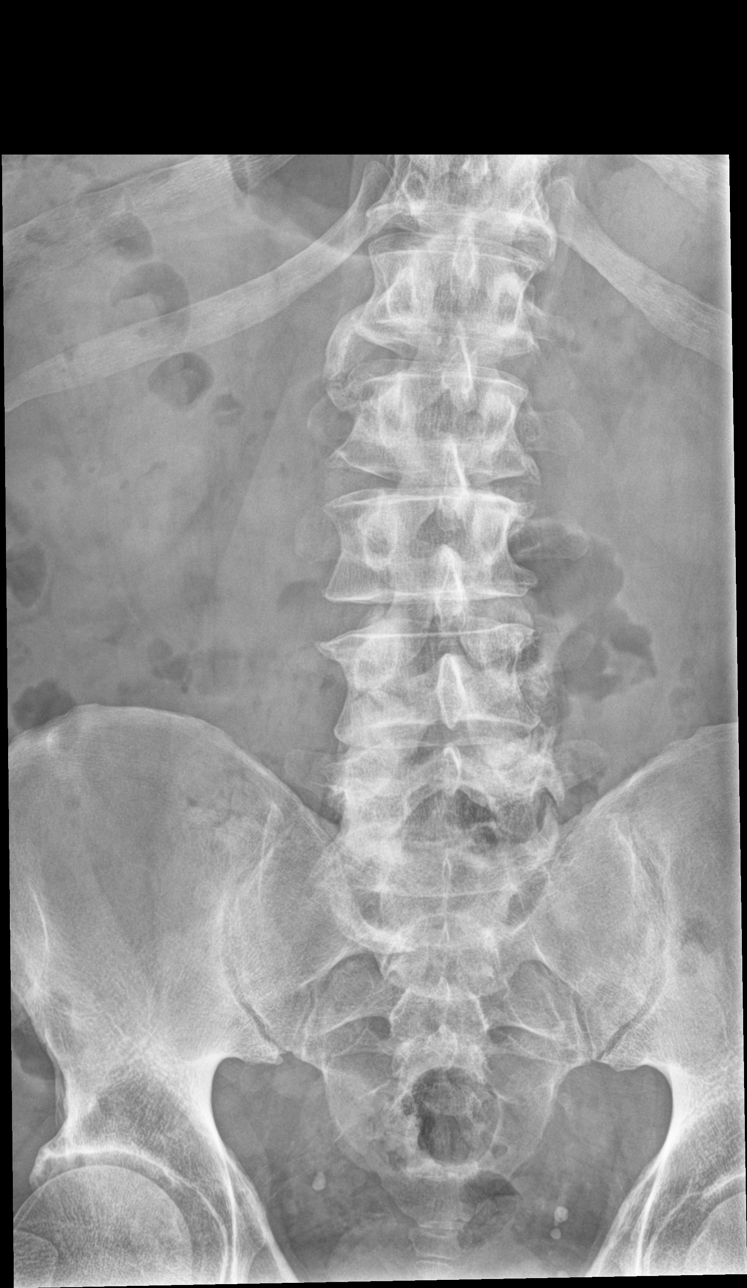

[l-spine obl (1 of 2)]
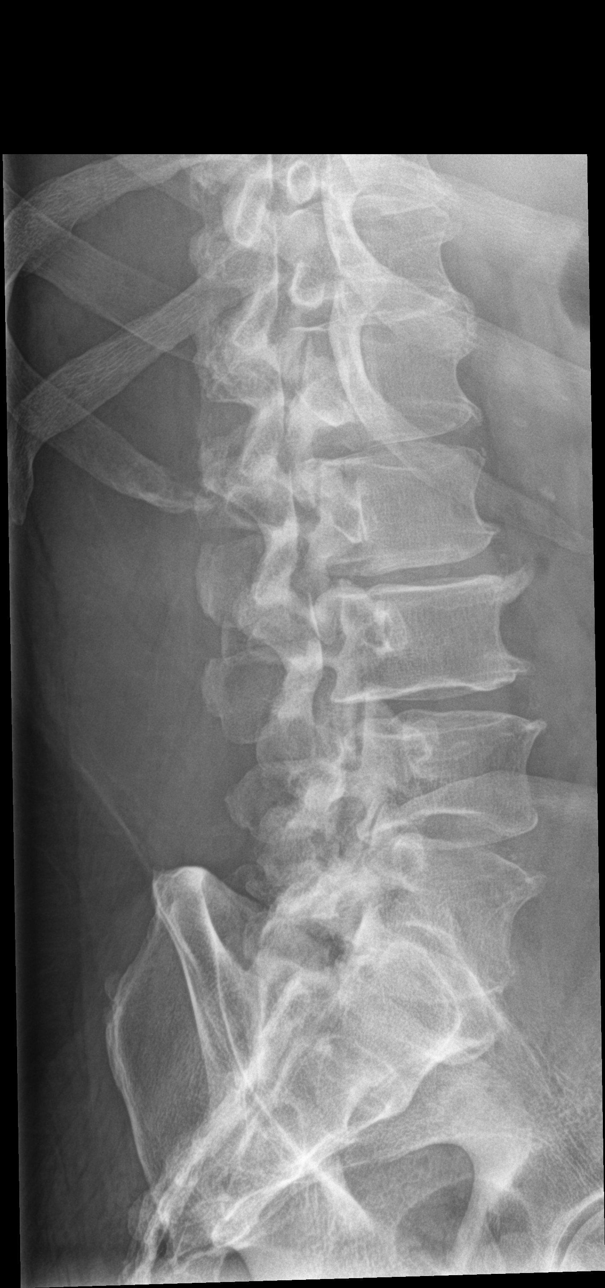

[l-spine obl (2 of 2)]
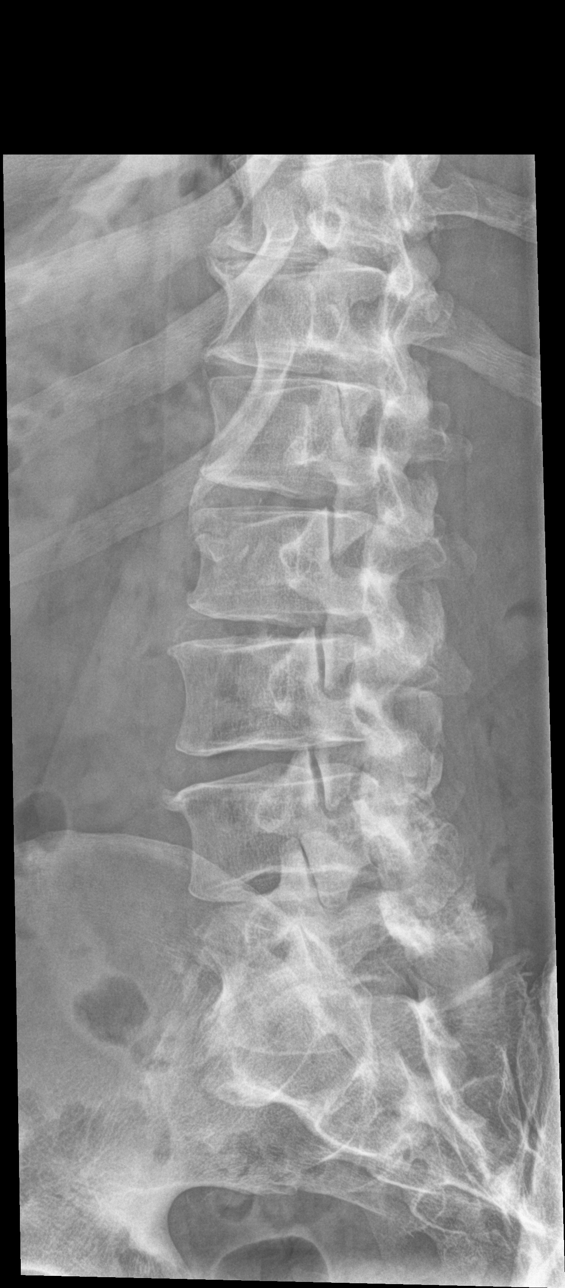

[l-spine lat]
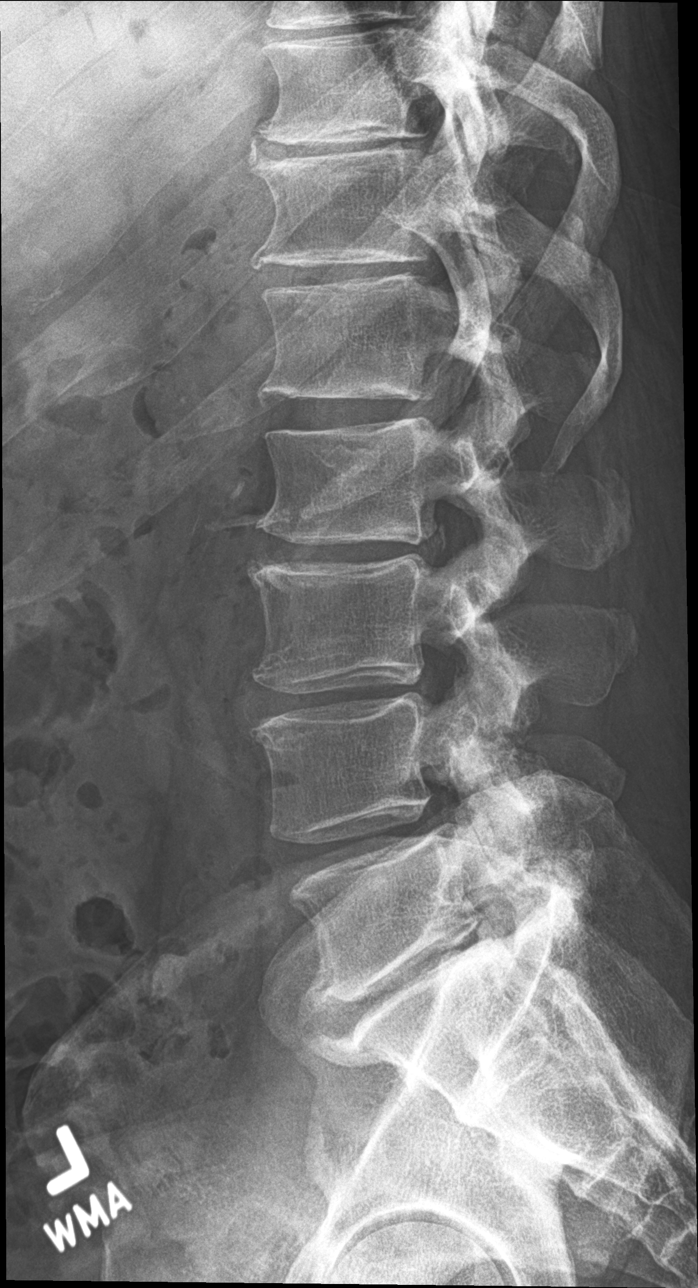

[l-spine spot]
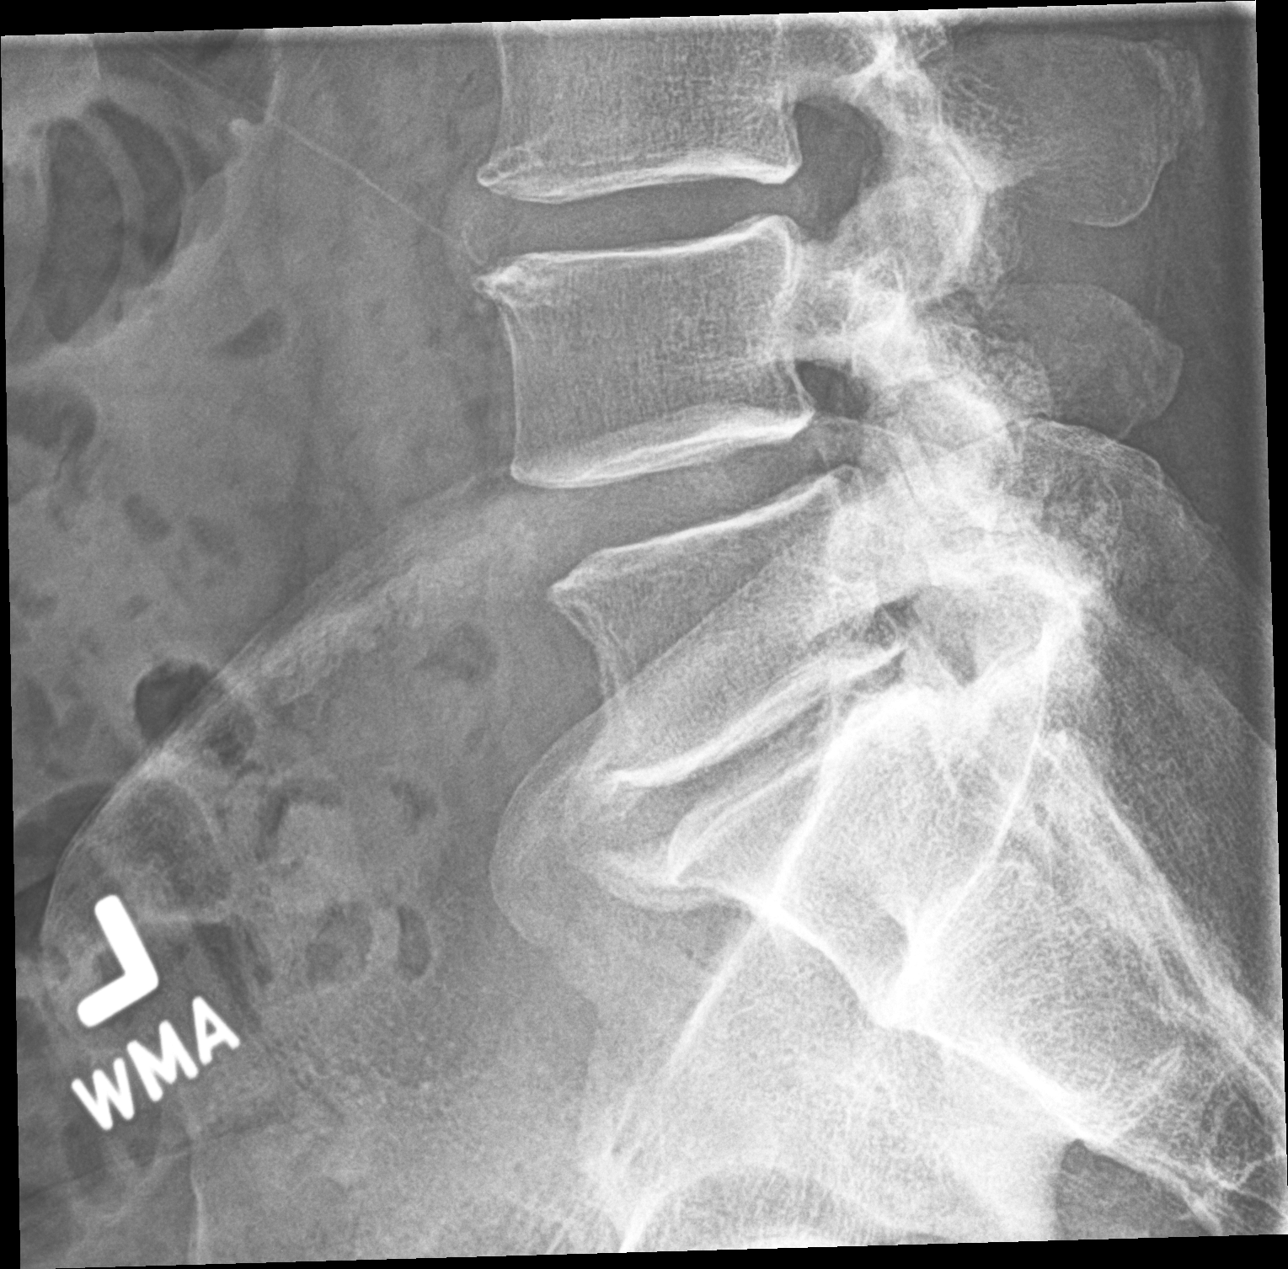

[5 of 5 positions shown; findings below may reference images not displayed]

FINDINGS: Straightened lumbar lordosis. Slight, grade 1, anterolisthesis of L4
on L5. Mild dextroscoliosis, apex at L2-L3.

Mild loss of disc height at L2-L3 and L3-L4 with moderate loss of
disc height at L5-S1. Are small endplate osteophytes most evident at
L5-S1. Facet joint narrowing, right greater than left, L4-L5 and
L5-S1.

Soft tissues are unremarkable.
IMPRESSION: 1. No fracture or acute finding.
2. Disc and lower lumbar spine facet degenerative changes. Slight
anterolisthesis of L4 on L5. Disc degenerative changes have
progressed at L2-L3 and L3-L4 since the prior radiographs. L5-S1
degenerative changes are similar.

## 2023-11-24 ENCOUNTER — Emergency Department
Admission: EM | Admit: 2023-11-24 | Discharge: 2023-11-24 | Disposition: A | Discharge status: Home / Self Care | Source: Ambulatory Visit

## 2023-11-24 ENCOUNTER — Emergency Department

## 2023-11-24 ENCOUNTER — Ambulatory Visit: Admission: EM | Admit: 2023-11-24 | Discharge: 2023-11-24 | Discharge status: Against Medical Advice

## 2023-11-24 ENCOUNTER — Other Ambulatory Visit: Payer: Self-pay

## 2023-11-24 DIAGNOSIS — R7309 Other abnormal glucose: Secondary | ICD-10-CM

## 2023-11-24 DIAGNOSIS — E119 Type 2 diabetes mellitus without complications: Secondary | ICD-10-CM | POA: Diagnosis not present

## 2023-11-24 DIAGNOSIS — R519 Headache, unspecified: Secondary | ICD-10-CM | POA: Insufficient documentation

## 2023-11-24 DIAGNOSIS — I10 Essential (primary) hypertension: Secondary | ICD-10-CM | POA: Diagnosis not present

## 2023-11-24 DIAGNOSIS — Z794 Long term (current) use of insulin: Secondary | ICD-10-CM

## 2023-11-24 DIAGNOSIS — W19XXXA Unspecified fall, initial encounter: Secondary | ICD-10-CM | POA: Diagnosis not present

## 2023-11-24 DIAGNOSIS — E1165 Type 2 diabetes mellitus with hyperglycemia: Secondary | ICD-10-CM

## 2023-11-24 DIAGNOSIS — R42 Dizziness and giddiness: Secondary | ICD-10-CM

## 2023-11-24 DIAGNOSIS — R55 Syncope and collapse: Secondary | ICD-10-CM | POA: Diagnosis present

## 2023-11-24 LAB — CBC
HCT: 42.3 % (ref 39.0–52.0)
Hemoglobin: 14.6 g/dL (ref 13.0–17.0)
MCH: 29.7 pg (ref 26.0–34.0)
MCHC: 34.5 g/dL (ref 30.0–36.0)
MCV: 86.2 fL (ref 80.0–100.0)
Platelets: 245 K/uL (ref 150–400)
RBC: 4.91 MIL/uL (ref 4.22–5.81)
RDW: 12.9 % (ref 11.5–15.5)
WBC: 6.6 K/uL (ref 4.0–10.5)
nRBC: 0 % (ref 0.0–0.2)

## 2023-11-24 LAB — URINALYSIS, ROUTINE W REFLEX MICROSCOPIC
Bilirubin Urine: NEGATIVE
Glucose, UA: NEGATIVE mg/dL
Hgb urine dipstick: NEGATIVE
Ketones, ur: NEGATIVE mg/dL
Leukocytes,Ua: NEGATIVE
Nitrite: NEGATIVE
Protein, ur: NEGATIVE mg/dL
Specific Gravity, Urine: 1.009 (ref 1.005–1.030)
pH: 5 (ref 5.0–8.0)

## 2023-11-24 LAB — COMPREHENSIVE METABOLIC PANEL WITH GFR
ALT: 20 U/L (ref 0–44)
AST: 21 U/L (ref 15–41)
Albumin: 4.1 g/dL (ref 3.5–5.0)
Alkaline Phosphatase: 65 U/L (ref 38–126)
Anion gap: 11 (ref 5–15)
BUN: 35 mg/dL — ABNORMAL HIGH (ref 8–23)
CO2: 22 mmol/L (ref 22–32)
Calcium: 9.3 mg/dL (ref 8.9–10.3)
Chloride: 102 mmol/L (ref 98–111)
Creatinine, Ser: 1.71 mg/dL — ABNORMAL HIGH (ref 0.61–1.24)
GFR, Estimated: 45 mL/min — ABNORMAL LOW (ref >60)
Glucose, Bld: 156 mg/dL — ABNORMAL HIGH (ref 70–99)
Potassium: 4.1 mmol/L (ref 3.5–5.1)
Sodium: 135 mmol/L (ref 135–145)
Total Bilirubin: 0.7 mg/dL (ref 0.0–1.2)
Total Protein: 7 g/dL (ref 6.5–8.1)

## 2023-11-24 LAB — D-DIMER, QUANTITATIVE: D-Dimer, Quant: 0.32 ug{FEU}/mL (ref 0.00–0.50)

## 2023-11-24 LAB — GLUCOSE, CAPILLARY: Glucose-Capillary: 220 mg/dL — ABNORMAL HIGH (ref 70–99)

## 2023-11-24 NOTE — Discharge Instructions (Signed)
 You were seen in the emergency department after syncopal episode that occurred yesterday.  Given the history of this episode I largely suspect this is secondary to the increase in your new medication.  Please continue to ensure that you take adequate p.o. with a low-carb diet.  Follow-up with your endocrinologist tomorrow.  I have placed a referral to cardiology for further restratification for you.  Please return with any acutely worsening symptoms or any other emergency.  Is very nice meeting you and I wish you the best of luck with everything. Rolland Moats, MD, PhD - RETURN PRECAUTIONS & AFTERCARE: (ENGLISH) RETURN PRECAUTIONS: Return immediately to the emergency department or see/call your doctor if you feel worse, weak or have changes in speech or vision, are short of breath, have fever, vomiting, pain, bleeding or dark stool, trouble urinating or any new issues. Return here or see/call your doctor if not improving as expected for your suspected condition. FOLLOW-UP CARE: Call your doctor and/or any doctors we referred you to for more advice and to make an appointment. Do this today, tomorrow or after the weekend. Some doctors only take PPO insurance so if you have HMO insurance you may want to contact your HMO or your regular doctor for referral to a specialist within your plan. Either way tell the doctor's office that it was a referral from the emergency department so you get the soonest possible appointment.  YOUR TEST RESULTS: Take result reports of any blood or urine tests, imaging tests and EKG's to your doctor and any referral doctor. Have any abnormal tests repeated. Your doctor or a referral doctor can let you know when this should be done. Also make sure your doctor contacts this hospital to get any test results that are not currently available such as cultures or special tests for infection and final imaging reports, which are often not available at the time you leave the ER but which may list  additional important findings that are not documented on the preliminary report. BLOOD PRESSURE: If your blood pressure was greater than 120/80 have your blood pressure rechecked within 1 to 2 weeks. MEDICATION SIDE EFFECTS: Do not drive, walk, bike, take the bus, etc. if you have received or are being prescribed any sedating medications such as those for pain or anxiety or certain antihistamines like Benadryl. If you have been give one of these here get a taxi home or have a friend drive you home. Ask your pharmacist to counsel you on potential side effects of any new medication

## 2023-11-24 NOTE — Discharge Instructions (Addendum)

## 2023-11-24 NOTE — ED Triage Notes (Addendum)
 Pt to ED for syncopal episode yesterday when got up. Hit his head against the wall. Pt takes Manjaro for DM. Blood sugars have been running around 200. Has been mildly dizzy today. Denies CP, SOB. Ambulatory with steady gait. Also some L neck pain since the fall and mild HA 3/10.

## 2023-11-24 NOTE — ED Triage Notes (Addendum)
 Pt c/o head injury d/t fall yesterday. States he got up to go the bathroom & lost consciousness. Hit head against wall. Feeling woozy currently & felt like he was going to pass out again at work today. Denies any HA,blurred vision or photophobia.

## 2023-11-24 NOTE — ED Provider Notes (Signed)
 [facility]    CSN: [id number] Arrival date & time: 11/24/23  1349      History   Chief Complaint Chief Complaint  Patient presents with   Head Injury   Loss of Consciousness    HPI Erik Larsen is a 63 y.o. male with history of anxiety/depression, T2DM with long term use of insulin , hypertension, hyperlipidemia, and GERD.   Patient presents today for evaluation after syncopal episode that occurred yesterday.  Patient reports he was getting ready to go to work and went to his bathroom.  States he felt something come over him and fell against the wall hitting his head, falling down and passing out for several seconds.  He reports that he was able to get up and work his regular shift but he felt like he was going to pass out multiple times while working.  His blood sugar was checked and was around 300 when he had the fall.  He reports a similar episode a few months ago.  Patient believes he had the syncopal episode related to Mounjaro which she started a few months ago.  He says whenever he takes Mounjaro and eats processed foods he feels faint and dizzy.  He has an appointment with his endocrinologist tomorrow.  At this time he is complaining of headache, sinus pressure and dizziness.  Denies any vomiting.  Has had an episode of diarrhea in the past 24 hours which he also attributes to Mounjaro.  No associated vision changes, weakness, chest pain, shortness of breath.  Reports numbness to the right side of his head. Reports feeling a little out of it today. He believes he might be dehydrated and that is what caused him to fall.  Patient is supposed to work tonight but does not know if he should.  HPI  Past Medical History:  Diagnosis Date   Anxiety    Chronic kidney disease    Depression    Diabetes mellitus without complication (HCC)    Hypercholesteremia    Hyperlipidemia    Hypertension    Secondary hyperparathyroidism of renal origin [facility])     Patient Active  Problem List   Diagnosis Date Noted   Type 2 diabetes mellitus with stage 3b chronic kidney disease, with long-term current use of insulin  (HCC) 06/28/2019   Secondary hyperparathyroidism of renal origin (HCC) 05/04/2019   Morbid obesity (HCC) 08/20/2018   Type 2 diabetes mellitus with diabetic chronic kidney disease (HCC) 10/30/2017   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 Authorizing Provider  famotidine (PEPCID) 20 MG tablet Take 1 tablet by mouth 2 (two) times daily. 02/26/23 02/26/24 Yes [provider]  MOUNJARO 10 MG/0.5ML Pen Inject 0.5 mLs (10 mg total) subcutaneously once a week 10/14/23  Yes [provider]  triamcinolone  cream (KENALOG ) 0.1 % Apply topically 2 (two) times daily to arm 10/28/23 10/27/24 Yes [provider]  amLODipine  (NORVASC ) 5 MG tablet Take 1 tablet by mouth daily. 04/17/15   [provider]  atorvastatin (LIPITOR) 10 MG tablet Take 10 mg  by mouth daily.    [provider]  benzonatate  (TESSALON ) 200 MG capsule Take 1 capsule (200 mg total) by mouth 3 (three) times daily as needed for cough. 06/09/23   Bernardino Ditch, NP  Continuous Blood Gluc Sensor (FREESTYLE LIBRE 2 SENSOR) MISC  01/30/22   [provider]  DULoxetine (CYMBALTA) 20 MG capsule Take 20 mg by mouth daily.    [provider]  insulin  glargine, 2 Unit Dial, (TOUJEO  MAX SOLOSTAR) 300 UNIT/ML Solostar Pen Inject 78 Units into the skin daily. 05/08/22   [provider]  Insulin   Pen Needle (BD PEN NEEDLE NANO U/F) 32G X 4 MM MISC Use 1 each once daily E11.65 02/17/17   [provider]  ipratropium (ATROVENT ) 0.06 % nasal spray Place 2 sprays into both nostrils 2 (two) times daily. 06/09/23   Bernardino Ditch, NP  lisinopril -hydrochlorothiazide  (ZESTORETIC ) 20-25 MG tablet Take 1 tablet by mouth daily. 10/30/18   Cook, Jayce G, DO  loratadine  (CLARITIN ) 10 MG tablet Take 1 tablet (10 mg total) by mouth daily. 12/03/22   Arvis Huxley B, PA-C  metFORMIN (GLUCOPHAGE) 1000 MG tablet TAKE 1 TABLET BY MOUTH TWICE DAILY WITH MEALS. 12/14/14   [provider]  metoprolol succinate (TOPROL-XL) 25 MG 24 hr tablet 25 mg daily. 10/19/18   [provider]  NOVOLOG FLEXPEN 100 UNIT/ML FlexPen Inject 18-32 Units into the skin 3 (three) times daily with meals. Based on sliding scale    [provider]  promethazine -dextromethorphan (PROMETHAZINE -DM) 6.25-15 MG/5ML syrup Take 5 mLs by mouth 4 (four) times daily as needed. 06/09/23   Bernardino Ditch, NP  hydrochlorothiazide  (HYDRODIURIL ) 12.5 MG tablet Take by mouth. 03/20/18 10/30/18  [provider]  lisinopril  (PRINIVIL ,ZESTRIL ) 5 MG tablet TAKE 1 TABLET BY MOUTH ONCE DAILY 04/17/15 10/30/18  [provider]    Family History Family History  Problem Relation Age of Onset   Thyroid disease Mother    Cancer Mother    Diabetes Mother     Social History Social History   Tobacco Use   Smoking status: Former    Current packs/day: 0.00    Average packs/day: 1.5 packs/day for 2.0 years (3.0 ttl pk-yrs)    Types: Cigarettes    Start date: 05/06/2005    Quit date: 05/07/2007    Years since quitting: 16.5   Smokeless tobacco: Never  Vaping Use   Vaping status: Never Used  Substance Use Topics   Alcohol use: No   Drug use: No     Allergies   Sildenafil, Gabapentin, and Viagra [sildenafil citrate]   Review of Systems Review of Systems  Constitutional:  Positive for fatigue.  Eyes:   Negative for photophobia and visual disturbance.  Respiratory:  Negative for shortness of breath.   Cardiovascular:  Negative for chest pain.  Gastrointestinal:  Positive for diarrhea. Negative for abdominal pain, nausea and vomiting.  Musculoskeletal:  Negative for arthralgias and joint swelling.  Neurological:  Positive for dizziness, syncope, light-headedness, numbness (right side of head) and headaches. Negative for tremors, seizures, facial asymmetry, speech difficulty and weakness.  Psychiatric/Behavioral:  Positive for confusion.      Physical Exam Triage Vital Signs ED Triage Vitals  Encounter Vitals Group     BP 11/24/23 1403 132/83     Girls Systolic BP Percentile --      Girls Diastolic BP Percentile --      Boys Systolic BP Percentile --      Boys Diastolic BP Percentile --  Pulse Rate 11/24/23 1403 90     Resp 11/24/23 1403 16     Temp 11/24/23 1403 98.5 F (36.9 C)     Temp Source 11/24/23 1403 Oral     SpO2 11/24/23 1403 96 %     Weight 11/24/23 1402 221 lb (100.2 kg)     Height 11/24/23 1402 5' 8 (1.727 m)     Head Circumference --      Peak Flow --      Pain Score 11/24/23 1409 0     Pain Loc --      Pain Education --      Exclude from Growth Chart --    No data found.  Updated Vital Signs BP 132/83 (BP Location: Left Arm)   Pulse 90   Temp 98.5 F (36.9 C) (Oral)   Resp 16   Ht 5' 8 (1.727 m)   Wt 221 lb (100.2 kg)   SpO2 96%   BMI 33.60 kg/m      Physical Exam Vitals and nursing note reviewed.  Constitutional:      General: He is not in acute distress.    Appearance: Normal appearance. He is well-developed. He is not ill-appearing.  HENT:     Head: Normocephalic and atraumatic.     Nose: Nose normal.     Mouth/Throat:     Mouth: Mucous membranes are moist.     Pharynx: Oropharynx is clear.  Eyes:     General: No scleral icterus.    Extraocular Movements: Extraocular movements intact.     Conjunctiva/sclera: Conjunctivae normal.      Pupils: Pupils are equal, round, and reactive to light.  Cardiovascular:     Rate and Rhythm: Normal rate and regular rhythm.  Pulmonary:     Effort: Pulmonary effort is normal. No respiratory distress.     Breath sounds: Normal breath sounds.  Musculoskeletal:     Cervical back: Neck supple.  Skin:    General: Skin is warm and dry.     Capillary Refill: Capillary refill takes less than 2 seconds.  Neurological:     General: No focal deficit present.     Mental Status: He is alert and oriented to person, place, and time. Mental status is at baseline.     Cranial Nerves: No cranial nerve deficit.     Motor: No weakness.     Coordination: Coordination normal.     Gait: Gait normal.  Psychiatric:        Mood and Affect: Mood normal.        Behavior: Behavior normal.      UC Treatments / Results  Labs (all labs ordered are listed, but only abnormal results are displayed) Labs Reviewed  GLUCOSE, CAPILLARY - Abnormal; Notable for the following components:      Result Value   Glucose-Capillary 220 (*)    All other components within normal limits  CBG MONITORING, ED    EKG   Radiology No results found.  Procedures ED EKG  Date/Time: 11/24/2023 2:52 PM  Performed by: Arvis Jolan NOVAK, PA-C Authorized by: Arvis Jolan NOVAK, PA-C   Previous ECG:    Previous ECG:  Compared to current   Similarity:  No change Interpretation:    Interpretation: abnormal   Rate:    ECG rate:  89 Rhythm:    Rhythm: sinus rhythm   Ectopy:    Ectopy: none   QRS:    QRS axis:  Normal   QRS intervals:  Normal   QRS conduction: RBBB   ST segments:    ST segments:  Normal T waves:    T waves: normal   Comments:     Normal sinus rhythm. RBBB. EKG compared to one from a few months ago and no changes.  (including critical care time)  Medications Ordered in UC Medications - No data to display  Initial Impression / Assessment and Plan / UC Course  I have reviewed the triage vital  signs and the nursing notes.  Pertinent labs & imaging results that were available during my care of the patient were reviewed by me and considered in my medical decision making (see chart for details).   63 year old male with history of type 2 diabetes and long-term use of insulin  presents for evaluation of syncopal episode, head injury with loss of consciousness last night.  Reports a mild headache at this time feeling out of it.  Says he feels lightheaded at this time.  Has had some diarrhea and thinks he might be dehydrated.  Also thinks his Mounjaro medication could be relating to his symptoms.  Similar episode a couple months ago.  Vitals are stable and he is overall well-appearing.  No acute distress.  On exam, normal HEENT exam.  Normal cranial nerve exam.  Chest clear.  Heart regular rate and rhythm.  Fingerstick 220.  EKG performed today shows normal sinus rhythm and right bundle branch block.  This compared to previous EKG a few months ago shows no acute changes.  Discussed with patient that he should be evaluated in the emergency department for his head injury with loss of consciousness.  Advised he may need to have a CT of head performed especially since he did not have one a few months ago when he had a fall.  Patient agrees to go to ED for evaluation but signed an AMA form.  Declines EMS transport and plans to take himself.  He feels confident that he will pull over to the side of the road if he feels too lightheaded or dizzy and unable to drive.  Advised patient of the dangers of driving himself post head injury and while feeling dizzy.  Explained that he is a threat to his own life and life of others.  Patient signed AMA and was sleeping in stable condition and route to pharmacy.  *** Final Clinical Impressions(s) / UC Diagnoses   Final diagnoses:  Syncope and collapse  Type 2 diabetes mellitus with hyperglycemia, with long-term current use of insulin  [facility])     Discharge  Instructions      You have been advised to follow up immediately in the emergency department for concerning signs.symptoms. If you declined EMS transport, please have a family member take you directly to the ED at this time. Do not delay. Based on concerns about condition, if you do not follow up in the ED, you may risk poor outcomes including worsening of condition, delayed treatment and potentially life threatening issues. If you have declined to go to the ED at this time, you should call your PCP immediately to set up a follow up appointment.  Go to ED for red flag symptoms, including; fevers you cannot reduce with Tylenol/Motrin, severe headaches, vision changes, numbness/weakness in part of the body, lethargy, confusion, intractable vomiting, severe dehydration, chest pain, breathing difficulty, severe persistent abdominal or pelvic pain, signs of severe infection (increased redness, swelling of an area), feeling faint or passing out, dizziness, etc. You should especially go to the  ED for sudden acute worsening of condition if you do not elect to go at this time.      ED Prescriptions   None    PDMP not reviewed this encounter.

## 2023-11-24 NOTE — ED Provider Notes (Signed)
 [facility] Provider Note    Event Date/Time   First MD Initiated Contact with Patient 11/24/23 2212     (approximate)   History   Loss of Consciousness   HPI  Erik Larsen is a 63 y.o. male with a past medical history of type 2 diabetes, anxiety depression hyperlipidemia hypertension GERD, chronic right bundle branch block who presents to our emergency department with headache after syncope and fall yesterday.  Patient states that 2 weeks ago his dose of Mounjaro was increased and he has been having a lot of GI distress with this new dose.  Yesterday evening he felt as if he was going to have to have diarrhea and went to the bathroom.  Unfortunately when he went to the bathroom he had a syncope and fall.  He does think he hit his head.  He awoke eat some food and went to work the next morning.  He denies any chest pain shortness of breath, abdominal pain changes in urinary habits.  He went to urgent care facility and came to our facility upon their recommendation.  He is scheduled to see his endocrinologist tomorrow.      Physical Exam   Triage Vital Signs: ED Triage Vitals [11/24/23 1559]  Encounter Vitals Group     BP 117/65     Girls Systolic BP Percentile      Girls Diastolic BP Percentile      Boys Systolic BP Percentile      Boys Diastolic BP Percentile      Pulse Rate 87     Resp 20     Temp 98.2 F (36.8 C)     Temp Source Oral     SpO2 96 %     Weight      Height      Head Circumference      Peak Flow      Pain Score 3     Pain Loc      Pain Education      Exclude from Growth Chart     Most recent vital signs: Vitals:   11/24/23 2223 11/24/23 2322  BP: (!) 131/91 (!) 127/90  Pulse: 73 72  Resp: 17 18  Temp: 97.7 F (36.5 C) 97.8 F (36.6 C)  SpO2: 100% 100%    Nursing Triage Note reviewed. Vital signs reviewed and patients oxygen saturation is normoxic  General: Patient is well nourished, well developed, awake and  alert, resting comfortably in no acute distress Head: Normocephalic,  Eyes: Normal inspection, extraocular muscles intact, no conjunctival pallor Ear, nose, throat: Normal external exam Neck: Normal range of motion Respiratory: Patient is in no respiratory distress, lungs CTAB Cardiovascular: Patient is not tachycardic, RRR without murmur appreciated GI: Abd SNT with no guarding or rebound  Back: Normal inspection of the back with good strength and range of motion throughout all ext Extremities: pulses intact with good cap refills, no LE pitting edema or calf tenderness Neuro: The patient is alert and oriented to person, place, and time, appropriately conversive, with 5/5 bilat UE/LE strength, no gross motor or sensory defects noted. Coordination appears to be adequate.  Ambulates without ataxia Skin: Warm, dry, and intact Psych: normal mood and affect, no SI or HI  ED Results / Procedures / Treatments   Labs (all labs ordered are listed, but only abnormal results are displayed) Labs Reviewed  COMPREHENSIVE METABOLIC PANEL WITH GFR - Abnormal; Notable for the following components:  Result Value   Glucose, Bld 156 (*)    BUN 35 (*)    Creatinine, Ser 1.71 (*)    GFR, Estimated 45 (*)    All other components within normal limits  URINALYSIS, ROUTINE W REFLEX MICROSCOPIC - Abnormal; Notable for the following components:   Color, Urine STRAW (*)    APPearance CLEAR (*)    All other components within normal limits  CBC  D-DIMER, QUANTITATIVE     EKG EKG and rhythm strip are interpreted by myself:   EKG: [Normal sinus rhythm] at heart rate of 89, normal QRS duration, QTc 479, normal nonspecific ST segments and T waves no ectopy RBB stable from March 2025 EKG EKG not consistent with Acute STEMI Rhythm strip: NSR in lead II   RADIOLOGY CT head without contrast: No intracranial hemorrhage on my independent review and interpretation and radiologist agrees CT -c spine: No acute  abnormality    PROCEDURES:  Critical Care performed: No  Procedures   MEDICATIONS ORDERED IN ED: Medications - No data to display   IMPRESSION / MDM / ASSESSMENT AND PLAN / ED COURSE                                Differential diagnosis includes, but is not limited to, arrhythmia, intracranial hemorrhage, postconcussive syndrome, C-spine fracture, medication reaction, electrolyte derangement, PE   ED course: Patient is well-appearing and on my exam without any acute deficits.  CT head and C-spine were unremarkable.  EKG demonstrated no acute arrhythmia but given his right bundle branch block decision made to obtain D-dimer to evaluate for PE which was unremarkable (patient is satting well on room air and denies any chest pain or shortness of breath).  I largely suspect that his syncope is secondary to the increase in Mounjaro and needing to use the restroom and a vagal reaction however patient does have real risk factors for syncope and will need to be further with stratified.  I am reassured that he will follow-up with his endocrinologist tomorrow.  I have also placed an outpatient referral to chronic cardiology follow-up given his syncope as he will possibly need an outpatient echocardiogram.  Patient feels well and requests discharge.  He will return if any acutely worsening symptoms  At time of discharge there is no evidence of acute life, limb, vision, or fertility threat. Patient has stable vital signs, pain is well controlled, patient is ambulatory and p.o. tolerant.  Discharge instructions were completed using the [employer] system. I would refer you to those at this time. All warnings prescriptions follow-up etc. were discussed in detail with the patient. Patient indicates understanding and is agreeable with this plan. All questions answered.  Patient is made aware that they may return to the emergency department for any worsening or new condition or for any other  emergency.    Clinical Course as of 11/24/23 2353  Mon Nov 24, 2023  2212 Comprehensive metabolic panel(!) [HD]  2212 No profound electrolyte derangements, creatinine is within patient's baseline [HD]  2213 CBC No anemia or profound electrolyte judgments [HD]  2213 CT Head Wo Contrast CT head unremarkable [HD]  2213 CT Cervical Spine Wo Contrast CT C-spine unremarkable [HD]  2258 D-Dimer, Quant: 0.32 Not elevated [HD]  2259 I reviewed all the results [HD]    Clinical Course User Index [HD] Nicholaus Rolland BRAVO, MD   Risk: 5 This patient has a high risk of  morbidity due to further diagnostic testing or treatment. Rationale: This patient's evaluation and management involve a high risk of morbidity due to the potential severity of presenting symptoms, need for diagnostic testing, and/or initiation of treatment that may require close monitoring. The differential includes conditions with potential for significant deterioration or requiring escalation of care. Treatment decisions in the ED, including medication administration, procedural interventions, or disposition planning, reflect this level of risk. Additional Support: -- Drug therapy requiring intensive monitoring for toxicity [ ]  -- Decision regarding elective major surgery with idenitified patient or procedure risk factors [ ]  -- Decision regarding hospitalization or escalation of hospital-level care [ ]  -- Decision not to resuscitate or to de-escalate care because of poor prognosis [ ]  -- Parental controlled substances [ ]   COPA: 5 The patient has a severe exacerbation, progression, or side effect of treatment of the following illness/illnesses: []  OR  The patient has the following acute or chronic illness/injury that poses a possible threat to life or bodily function: [X] : The patient has a potentially serious acute condition or an acute exacerbation of a chronic illness requiring urgent evaluation and management in the Emergency  Department. The clinical presentation necessitates immediate consideration of life-threatening or function-threatening diagnoses, even if they are ultimately ruled out.  Data(2/3 categories following were performed): 5 I reviewed or ordered at least three unique tests, external notes, and/or the history required an independent historian as one of the three requirements as following: CBC, D-dimer, CMP, urgent care note from earlier today AND  I independently interpreted the following test: CT head OR  I discussed the management of the patient with the following external physician or qualified healthcare provider: []     Suggested E/M Coding Level: 5, 99285, This has been selected based on the 12/21/21 CPT guidelines for E/M codes in the Emergency Department based on 2/3 of the CoPA, Data, and Risk.    FINAL CLINICAL IMPRESSION(S) / ED DIAGNOSES   Final diagnoses:  Syncope and collapse  Nonintractable headache, unspecified chronicity pattern, unspecified headache type  Labile blood glucose     Rx / DC Orders   ED Discharge Orders          Ordered    Ambulatory referral to Cardiology       Comments: If you have not heard from the Cardiology office within the next 72 hours please call [phone].   11/24/23 12/21/2300             Note:  This document was prepared using Dragon voice recognition software and may include unintentional dictation errors.   Nicholaus Rolland BRAVO, MD 11/24/23 [phone]
